# Patient Record
Sex: Female | Born: 1985 | Race: White | Hispanic: No | Marital: Married | Smoking: Never smoker
Health system: Southern US, Community
[De-identification: ages and names within clinical notes are randomized; demographics above are authoritative.]

## PROBLEM LIST (undated history)

## (undated) DIAGNOSIS — Z8052 Family history of malignant neoplasm of bladder: Secondary | ICD-10-CM

## (undated) DIAGNOSIS — Z8041 Family history of malignant neoplasm of ovary: Secondary | ICD-10-CM

## (undated) DIAGNOSIS — D62 Acute posthemorrhagic anemia: Secondary | ICD-10-CM

## (undated) DIAGNOSIS — Z803 Family history of malignant neoplasm of breast: Secondary | ICD-10-CM

## (undated) DIAGNOSIS — Z789 Other specified health status: Secondary | ICD-10-CM

## (undated) DIAGNOSIS — Z8042 Family history of malignant neoplasm of prostate: Secondary | ICD-10-CM

## (undated) HISTORY — DX: Family history of malignant neoplasm of ovary: Z80.41

## (undated) HISTORY — DX: Family history of malignant neoplasm of breast: Z80.3

## (undated) HISTORY — PX: NO PAST SURGERIES: SHX2092

## (undated) HISTORY — DX: Family history of malignant neoplasm of bladder: Z80.52

## (undated) HISTORY — DX: Family history of malignant neoplasm of prostate: Z80.42

---
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
## 2009-10-09 ENCOUNTER — Encounter: Admission: RE | Admit: 2009-10-09 | Discharge: 2009-12-25 | Payer: Self-pay | Admitting: Sports Medicine

## 2011-01-08 ENCOUNTER — Ambulatory Visit: Payer: Commercial Indemnity | Attending: Orthopedic Surgery | Admitting: Physical Therapy

## 2011-01-08 DIAGNOSIS — M25539 Pain in unspecified wrist: Secondary | ICD-10-CM | POA: Insufficient documentation

## 2011-01-08 DIAGNOSIS — IMO0001 Reserved for inherently not codable concepts without codable children: Secondary | ICD-10-CM | POA: Insufficient documentation

## 2011-01-08 DIAGNOSIS — R293 Abnormal posture: Secondary | ICD-10-CM | POA: Insufficient documentation

## 2011-01-16 ENCOUNTER — Ambulatory Visit: Payer: Commercial Indemnity | Admitting: Physical Therapy

## 2011-01-20 ENCOUNTER — Ambulatory Visit: Payer: Commercial Indemnity | Admitting: Physical Therapy

## 2011-01-21 ENCOUNTER — Ambulatory Visit: Payer: Commercial Indemnity | Admitting: Physical Therapy

## 2011-01-26 ENCOUNTER — Ambulatory Visit: Payer: Commercial Indemnity | Admitting: Physical Therapy

## 2011-01-29 ENCOUNTER — Ambulatory Visit: Payer: Commercial Indemnity | Admitting: Physical Therapy

## 2011-02-03 ENCOUNTER — Ambulatory Visit: Payer: Commercial Indemnity | Admitting: Physical Therapy

## 2011-02-04 ENCOUNTER — Ambulatory Visit: Payer: Commercial Indemnity | Attending: Orthopedic Surgery | Admitting: Physical Therapy

## 2011-02-04 DIAGNOSIS — M25539 Pain in unspecified wrist: Secondary | ICD-10-CM | POA: Insufficient documentation

## 2011-02-04 DIAGNOSIS — R293 Abnormal posture: Secondary | ICD-10-CM | POA: Insufficient documentation

## 2011-02-04 DIAGNOSIS — IMO0001 Reserved for inherently not codable concepts without codable children: Secondary | ICD-10-CM | POA: Insufficient documentation

## 2011-02-10 ENCOUNTER — Ambulatory Visit: Payer: Commercial Indemnity | Admitting: Physical Therapy

## 2011-03-05 ENCOUNTER — Ambulatory Visit: Payer: Commercial Indemnity | Attending: Orthopedic Surgery | Admitting: Physical Therapy

## 2011-03-05 DIAGNOSIS — M25539 Pain in unspecified wrist: Secondary | ICD-10-CM | POA: Insufficient documentation

## 2011-03-05 DIAGNOSIS — R293 Abnormal posture: Secondary | ICD-10-CM | POA: Insufficient documentation

## 2011-03-05 DIAGNOSIS — IMO0001 Reserved for inherently not codable concepts without codable children: Secondary | ICD-10-CM | POA: Insufficient documentation

## 2011-03-30 ENCOUNTER — Ambulatory Visit: Payer: Commercial Indemnity | Admitting: Physical Therapy

## 2015-03-03 NOTE — L&D Delivery Note (Signed)
Delivery Note At 2:54 AM a healthy female was delivered via Vaginal, Spontaneous Delivery (Presentation: Vertex). 1 loose [place].  APGAR: 9, 9 ; weight pending.   Placenta status: complete, 3 Vs.  Cord:  with the following complications: None.  Cord pH: N/A  Anesthesia:  Epidural Episiotomy: None Lacerations: 2nd degree Suture Repair: 2.0 3.0 vicryl rapide Est. Blood Loss (mL): 350  Mom to postpartum.  Baby to Couplet care / Skin to Skin.  Jaeden Messer,[name] 12/08/2015, 3:46 AM

## 2015-05-22 LAB — OB RESULTS CONSOLE HIV ANTIBODY (ROUTINE TESTING): HIV: NONREACTIVE

## 2015-05-22 LAB — OB RESULTS CONSOLE RPR: RPR: NONREACTIVE

## 2015-05-22 LAB — OB RESULTS CONSOLE ABO/RH: RH TYPE: POSITIVE

## 2015-05-22 LAB — OB RESULTS CONSOLE ANTIBODY SCREEN: ANTIBODY SCREEN: NEGATIVE

## 2015-05-22 LAB — OB RESULTS CONSOLE GC/CHLAMYDIA
Chlamydia: NEGATIVE
Gonorrhea: NEGATIVE

## 2015-05-22 LAB — OB RESULTS CONSOLE RUBELLA ANTIBODY, IGM: Rubella: IMMUNE

## 2015-05-22 LAB — OB RESULTS CONSOLE HEPATITIS B SURFACE ANTIGEN: HEP B S AG: NEGATIVE

## 2015-11-13 LAB — OB RESULTS CONSOLE GBS: STREP GROUP B AG: NEGATIVE

## 2015-12-07 ENCOUNTER — Inpatient Hospital Stay (HOSPITAL_COMMUNITY): Payer: BLUE CROSS/BLUE SHIELD | Admitting: Anesthesiology

## 2015-12-07 ENCOUNTER — Inpatient Hospital Stay (HOSPITAL_COMMUNITY)
Admission: AD | Admit: 2015-12-07 | Discharge: 2015-12-10 | DRG: 774 | Disposition: A | Discharge status: Home / Self Care | Payer: BLUE CROSS/BLUE SHIELD | Source: Ambulatory Visit | Attending: Obstetrics & Gynecology | Admitting: Obstetrics & Gynecology

## 2015-12-07 ENCOUNTER — Encounter (HOSPITAL_COMMUNITY): Payer: Self-pay | Admitting: Certified Nurse Midwife

## 2015-12-07 DIAGNOSIS — O9081 Anemia of the puerperium: Secondary | ICD-10-CM | POA: Diagnosis not present

## 2015-12-07 DIAGNOSIS — Z3A38 38 weeks gestation of pregnancy: Secondary | ICD-10-CM

## 2015-12-07 DIAGNOSIS — D62 Acute posthemorrhagic anemia: Secondary | ICD-10-CM | POA: Diagnosis not present

## 2015-12-07 DIAGNOSIS — O4292 Full-term premature rupture of membranes, unspecified as to length of time between rupture and onset of labor: Secondary | ICD-10-CM | POA: Diagnosis present

## 2015-12-07 DIAGNOSIS — E861 Hypovolemia: Secondary | ICD-10-CM | POA: Diagnosis not present

## 2015-12-07 HISTORY — DX: Acute posthemorrhagic anemia: D62

## 2015-12-07 HISTORY — DX: Other specified health status: Z78.9

## 2015-12-07 LAB — CBC
HEMATOCRIT: 34.3 % — AB (ref 36.0–46.0)
Hemoglobin: 11.3 g/dL — ABNORMAL LOW (ref 12.0–15.0)
MCH: 27.3 pg (ref 26.0–34.0)
MCHC: 32.9 g/dL (ref 30.0–36.0)
MCV: 82.9 fL (ref 78.0–100.0)
Platelets: 285 10*3/uL (ref 150–400)
RBC: 4.14 MIL/uL (ref 3.87–5.11)
RDW: 14.2 % (ref 11.5–15.5)
WBC: 15 10*3/uL — AB (ref 4.0–10.5)

## 2015-12-07 LAB — TYPE AND SCREEN
ABO/RH(D): O POS
Antibody Screen: NEGATIVE

## 2015-12-07 LAB — ABO/RH: ABO/RH(D): O POS

## 2015-12-07 MED ORDER — LACTATED RINGERS IV SOLN
INTRAVENOUS | Status: DC
  Administered 2015-12-07: 23:00:00 via INTRAVENOUS

## 2015-12-07 MED ORDER — LIDOCAINE HCL (PF) 1 % IJ SOLN
30.0000 mL | INTRAMUSCULAR | Status: DC | PRN
  Filled 2015-12-07: qty 30

## 2015-12-07 MED ORDER — FENTANYL 2.5 MCG/ML BUPIVACAINE 1/10 % EPIDURAL INFUSION (WH - ANES)
14.0000 mL/h | INTRAMUSCULAR | Status: DC | PRN
  Administered 2015-12-07: 14 mL/h via EPIDURAL
  Filled 2015-12-07: qty 125

## 2015-12-07 MED ORDER — PHENYLEPHRINE 40 MCG/ML (10ML) SYRINGE FOR IV PUSH (FOR BLOOD PRESSURE SUPPORT)
80.0000 ug | PREFILLED_SYRINGE | INTRAVENOUS | Status: DC | PRN
  Filled 2015-12-07: qty 5

## 2015-12-07 MED ORDER — FENTANYL 2.5 MCG/ML BUPIVACAINE 1/10 % EPIDURAL INFUSION (WH - ANES): 14.0000 mL/h | INTRAMUSCULAR | Status: DC | PRN

## 2015-12-07 MED ORDER — DIPHENHYDRAMINE HCL 50 MG/ML IJ SOLN: 12.5000 mg | INTRAMUSCULAR | Status: DC | PRN

## 2015-12-07 MED ORDER — ACETAMINOPHEN 325 MG PO TABS: 650.0000 mg | ORAL_TABLET | ORAL | Status: DC | PRN

## 2015-12-07 MED ORDER — LACTATED RINGERS IV SOLN: 500.0000 mL | Freq: Once | INTRAVENOUS | Status: DC

## 2015-12-07 MED ORDER — OXYTOCIN 40 UNITS IN LACTATED RINGERS INFUSION - SIMPLE MED
2.5000 [IU]/h | INTRAVENOUS | Status: DC
  Filled 2015-12-07: qty 1000

## 2015-12-07 MED ORDER — PHENYLEPHRINE 40 MCG/ML (10ML) SYRINGE FOR IV PUSH (FOR BLOOD PRESSURE SUPPORT)
80.0000 ug | PREFILLED_SYRINGE | INTRAVENOUS | Status: DC | PRN
  Filled 2015-12-07: qty 5
  Filled 2015-12-07: qty 10

## 2015-12-07 MED ORDER — OXYTOCIN BOLUS FROM INFUSION
500.0000 mL | Freq: Once | INTRAVENOUS | Status: AC
  Administered 2015-12-08: 500 mL via INTRAVENOUS

## 2015-12-07 MED ORDER — SOD CITRATE-CITRIC ACID 500-334 MG/5ML PO SOLN: 30.0000 mL | ORAL | Status: DC | PRN

## 2015-12-07 MED ORDER — FLEET ENEMA 7-19 GM/118ML RE ENEM: 1.0000 | ENEMA | RECTAL | Status: DC | PRN

## 2015-12-07 MED ORDER — OXYCODONE-ACETAMINOPHEN 5-325 MG PO TABS: 2.0000 | ORAL_TABLET | ORAL | Status: DC | PRN

## 2015-12-07 MED ORDER — LIDOCAINE HCL (PF) 1 % IJ SOLN
INTRAMUSCULAR | Status: DC | PRN
  Administered 2015-12-07 (×2): 5 mL

## 2015-12-07 MED ORDER — LACTATED RINGERS IV SOLN: 500.0000 mL | INTRAVENOUS | Status: DC | PRN

## 2015-12-07 MED ORDER — EPHEDRINE 5 MG/ML INJ
10.0000 mg | INTRAVENOUS | Status: DC | PRN
  Filled 2015-12-07: qty 4

## 2015-12-07 MED ORDER — ONDANSETRON HCL 4 MG/2ML IJ SOLN
4.0000 mg | Freq: Four times a day (QID) | INTRAMUSCULAR | Status: DC | PRN
  Administered 2015-12-07: 4 mg via INTRAVENOUS
  Filled 2015-12-07 (×2): qty 2

## 2015-12-07 MED ORDER — OXYCODONE-ACETAMINOPHEN 5-325 MG PO TABS: 1.0000 | ORAL_TABLET | ORAL | Status: DC | PRN

## 2015-12-07 MED ORDER — LACTATED RINGERS IV SOLN
500.0000 mL | Freq: Once | INTRAVENOUS | Status: AC
  Administered 2015-12-07: 500 mL via INTRAVENOUS

## 2015-12-07 NOTE — Anesthesia Procedure Notes (Signed)
Epidural Patient location during procedure: OB Start time: 12/07/2015 8:45 PM End time: 12/07/2015 8:52 PM  Staffing Anesthesiologist: Reginal Lutes Performed: anesthesiologist   Preanesthetic Checklist Completed: patient identified, site marked, surgical consent, pre-op evaluation, timeout performed, IV checked, risks and benefits discussed and monitors and equipment checked  Epidural Patient position: sitting Prep: site prepped and draped and DuraPrep Patient monitoring: continuous pulse ox and blood pressure Approach: midline Location: L4-L5 Injection technique: LOR saline  Needle:  Needle type: Tuohy  Needle gauge: 17 G Needle length: 9 cm and 9 Needle insertion depth: 5 cm cm Catheter type: closed end flexible Catheter size: 19 Gauge Catheter at skin depth: 10 cm Test dose: negative  Assessment Events: blood not aspirated, injection not painful, no injection resistance, negative IV test and no paresthesia

## 2015-12-07 NOTE — Anesthesia Pain Management Evaluation Note (Signed)
  CRNA Pain Management Visit Note  Patient: Kerry Potter, 30 y.o., female  "Hello I am a member of the anesthesia team at [facility]. We have an anesthesia team available at all times to provide care throughout the hospital, including epidural management and anesthesia for C-section. I don't know your plan for the delivery whether it a natural birth, water birth, IV sedation, nitrous supplementation, doula or epidural, but we want to meet your pain goals."   1.Was your pain managed to your expectations on prior hospitalizations?   Yes   2.What is your expectation for pain management during this hospitalization?     Epidural  3.How can we help you reach that goal? epidural  Record the patient's initial score and the patient's pain goal.   Pain: 7/10  Pain Goal: 4/10 [facility] wants you to be able to say your pain was always managed very well.  Ailene Ards 12/07/2015

## 2015-12-07 NOTE — Anesthesia Preprocedure Evaluation (Signed)
Anesthesia Evaluation  Patient identified by MRN, date of birth, ID band Patient awake    Reviewed: Allergy & Precautions, NPO status , Patient's Chart, lab work & pertinent test results  Airway Mallampati: II  TM Distance: >3 FB Neck ROM: Full    Dental no notable dental hx.    Pulmonary neg pulmonary ROS,    Pulmonary exam normal        Cardiovascular negative cardio ROS Normal cardiovascular exam     Neuro/Psych negative neurological ROS  negative psych ROS   GI/Hepatic negative GI ROS, Neg liver ROS,   Endo/Other  negative endocrine ROS  Renal/GU negative Renal ROS  negative genitourinary   Musculoskeletal negative musculoskeletal ROS (+)   Abdominal   Peds negative pediatric ROS (+)  Hematology negative hematology ROS (+)   Anesthesia Other Findings   Reproductive/Obstetrics (+) Pregnancy                             Anesthesia Physical Anesthesia Plan  ASA: II  Anesthesia Plan: Epidural   Post-op Pain Management:    Induction:   Airway Management Planned:   Additional Equipment:   Intra-op Plan:   Post-operative Plan:   Informed Consent:   Plan Discussed with:   Anesthesia Plan Comments:         Anesthesia Quick Evaluation

## 2015-12-07 NOTE — Progress Notes (Signed)
Subjective: Doing well, pain ++, UCs 2-3  Anesthesia Requesting epidural   Objective: BP 125/75   Pulse 92   Temp 98.3 F (36.8 C) (Oral)   Resp 18   Ht 5\' 6"  (1.676 m)   Wt 205 lb (93 kg)   SpO2 100%   BMI 33.09 kg/m    FHT:  FHR: 140-150 bpm, variability: moderate,  accelerations:  Present,  decelerations:  Absent UC:   regular, every 2-3 minutes VE:   Dilation: 4 Effacement (%): 90 Station: 0 Exam by:: Dr. Dellis Filbert   Assessment / Plan: Spontaneous labor, progressing normally  Fetal Wellbeing:  Category I Pain Control:  Epidural  Anticipated MOD:  NSVD  Kerry Potter,Kerry Potter 12/07/2015, 9:52 PM

## 2015-12-07 NOTE — H&P (Signed)
Kerry Potter is a 30 y.o. female G1P0 [redacted]w[redacted]d presenting for SROM, early spontaneous labor.  HPP/HPI:  FMs pos, no vaginal bleeding.  SROM clear AF ++.  Mild UCs q 3 min.  No PEC Sx.  Normal pregnancy.  GBS neg.  OB History    Gravida Para Term Preterm AB Living   1             SAB TAB Ectopic Multiple Live Births                 Past Medical History:  Diagnosis Date  . Medical history non-contributory    Past Surgical History:  Procedure Laterality Date  . NO PAST SURGERIES     Family History: family history is not on file. Social History:  reports that she has never smoked. She has never used smokeless tobacco. She reports that she does not drink alcohol or use drugs.  Allergies  Allergen Reactions  . Mobic [Meloxicam] Diarrhea    Dilation: 3 Effacement (%): 70 Station: -3 Exam by:: A. Jones RNC   Blood pressure 131/78, pulse 82, temperature 97.7 F (36.5 C), temperature source Oral, resp. rate 18, height 5\' 6"  (1.676 m), weight 205 lb (93 kg). Exam Physical Exam   UCs q 2-3 min ++ FHR 140-150's with good variability, accelerations present, no deceleration.  HPP:  Patient Active Problem List   Diagnosis Date Noted  . Normal labor 12/07/2015    Prenatal labs: ABO, Rh:  O Pos Antibody:  Neg Rubella: Immune RPR:  NR  HBsAg:  NR  HIV:  NR Genetic testing:  Ultrascreen neg, AFP1 neg [place] anato: wnl 1 hr GTT: 133 wnl GBS:  Neg  Assessment/Plan: G1 38 2/7 wks with SROM, early labor.  Normal pregnancy.  FHR cat 1.  Expectant management towards probable Vaginal delivery.  Epidural PRN.   Keisha Amer,[name] 12/07/2015, 6:18 PM

## 2015-12-07 NOTE — MAU Note (Signed)
Pt states her water broke at 2:30PM. Pt denies vaginal bleeding. Fetus is active. Pt states she feels pressure but no real ctxs.

## 2015-12-08 ENCOUNTER — Encounter (HOSPITAL_COMMUNITY): Payer: Self-pay | Admitting: *Deleted

## 2015-12-08 LAB — CBC
HCT: 24.9 % — ABNORMAL LOW (ref 36.0–46.0)
HCT: 22.8 % — ABNORMAL LOW (ref 36.0–46.0)
Hemoglobin: 8.6 g/dL — ABNORMAL LOW (ref 12.0–15.0)
Hemoglobin: 7.9 g/dL — ABNORMAL LOW (ref 12.0–15.0)
MCH: 27.9 pg (ref 26.0–34.0)
MCH: 28.4 pg (ref 26.0–34.0)
MCHC: 34.1 g/dL (ref 30.0–36.0)
MCHC: 34.6 g/dL (ref 30.0–36.0)
MCV: 81.6 fL (ref 78.0–100.0)
MCV: 82 fL (ref 78.0–100.0)
PLATELETS: 222 10*3/uL (ref 150–400)
Platelets: 202 10*3/uL (ref 150–400)
RBC: 3.05 MIL/uL — AB (ref 3.87–5.11)
RBC: 2.78 MIL/uL — ABNORMAL LOW (ref 3.87–5.11)
RDW: 14.2 % (ref 11.5–15.5)
RDW: 14.2 % (ref 11.5–15.5)
WBC: 21.6 10*3/uL — AB (ref 4.0–10.5)
WBC: 17.5 10*3/uL — ABNORMAL HIGH (ref 4.0–10.5)

## 2015-12-08 LAB — RPR: RPR: NONREACTIVE

## 2015-12-08 MED ORDER — TETANUS-DIPHTH-ACELL PERTUSSIS 5-2.5-18.5 LF-MCG/0.5 IM SUSP: 0.5000 mL | Freq: Once | INTRAMUSCULAR | Status: DC

## 2015-12-08 MED ORDER — ONDANSETRON HCL 4 MG PO TABS: 4.0000 mg | ORAL_TABLET | ORAL | Status: DC | PRN

## 2015-12-08 MED ORDER — WITCH HAZEL-GLYCERIN EX PADS
1.0000 "application " | MEDICATED_PAD | CUTANEOUS | Status: DC | PRN
  Administered 2015-12-09: 1 via TOPICAL

## 2015-12-08 MED ORDER — OXYTOCIN 40 UNITS IN LACTATED RINGERS INFUSION - SIMPLE MED: 2.5000 [IU]/h | INTRAVENOUS | Status: DC | PRN

## 2015-12-08 MED ORDER — ONDANSETRON HCL 4 MG/2ML IJ SOLN: 4.0000 mg | INTRAMUSCULAR | Status: DC | PRN

## 2015-12-08 MED ORDER — BENZOCAINE-MENTHOL 20-0.5 % EX AERO
1.0000 "application " | INHALATION_SPRAY | CUTANEOUS | Status: DC | PRN
  Administered 2015-12-08 – 2015-12-09 (×2): 1 via TOPICAL
  Filled 2015-12-08 (×4): qty 56

## 2015-12-08 MED ORDER — IBUPROFEN 800 MG PO TABS
800.0000 mg | ORAL_TABLET | Freq: Three times a day (TID) | ORAL | Status: DC
  Administered 2015-12-08 – 2015-12-10 (×7): 800 mg via ORAL
  Filled 2015-12-08 (×7): qty 1

## 2015-12-08 MED ORDER — LACTATED RINGERS IV SOLN
INTRAVENOUS | Status: AC
  Administered 2015-12-08 (×2): via INTRAVENOUS

## 2015-12-08 MED ORDER — ACETAMINOPHEN 325 MG PO TABS
650.0000 mg | ORAL_TABLET | ORAL | Status: DC | PRN
  Administered 2015-12-08 – 2015-12-10 (×7): 650 mg via ORAL
  Filled 2015-12-08 (×7): qty 2

## 2015-12-08 MED ORDER — LACTATED RINGERS IV SOLN
INTRAVENOUS | Status: DC
  Administered 2015-12-08: 11:00:00 via INTRAVENOUS

## 2015-12-08 MED ORDER — COCONUT OIL OIL
1.0000 | TOPICAL_OIL | Status: DC | PRN
Start: 2015-12-08 — End: 2015-12-10
  Administered 2015-12-09: 1 via TOPICAL
  Filled 2015-12-08: qty 120

## 2015-12-08 MED ORDER — SIMETHICONE 80 MG PO CHEW: 80.0000 mg | CHEWABLE_TABLET | ORAL | Status: DC | PRN

## 2015-12-08 MED ORDER — IBUPROFEN 600 MG PO TABS
600.0000 mg | ORAL_TABLET | Freq: Four times a day (QID) | ORAL | Status: DC
  Administered 2015-12-08: 600 mg via ORAL
  Filled 2015-12-08: qty 1

## 2015-12-08 MED ORDER — PRENATAL MULTIVITAMIN CH
1.0000 | ORAL_TABLET | Freq: Every day | ORAL | Status: DC
  Administered 2015-12-08 – 2015-12-10 (×3): 1 via ORAL
  Filled 2015-12-08 (×3): qty 1

## 2015-12-08 MED ORDER — SENNOSIDES-DOCUSATE SODIUM 8.6-50 MG PO TABS
2.0000 | ORAL_TABLET | ORAL | Status: DC
  Administered 2015-12-09 – 2015-12-10 (×2): 2 via ORAL
  Filled 2015-12-08 (×2): qty 2

## 2015-12-08 MED ORDER — DIPHENHYDRAMINE HCL 25 MG PO CAPS: 25.0000 mg | ORAL_CAPSULE | Freq: Four times a day (QID) | ORAL | Status: DC | PRN

## 2015-12-08 MED ORDER — DIBUCAINE 1 % RE OINT
1.0000 "application " | TOPICAL_OINTMENT | RECTAL | Status: DC | PRN
  Administered 2015-12-09: 1 via RECTAL
  Filled 2015-12-08: qty 28

## 2015-12-08 MED ORDER — ZOLPIDEM TARTRATE 5 MG PO TABS: 5.0000 mg | ORAL_TABLET | Freq: Every evening | ORAL | Status: DC | PRN

## 2015-12-08 NOTE — Progress Notes (Signed)
Assisted patient out of bed to the bathroom. Voided an adequate amount of urine. While sitting on the toilet the patient states she feels her headache is getting worse and her ears are ringing. I pulled the emergency bell and had a tech bring in the stedy. Upon standing the patient lost consciousness for about 30 seconds, with the assistance of a tech and RN we were able to get the patient to bed in the stedy. After getting into bed the patient stated she feels better but is hot. Vitals: 99/57, 98, 22, 99.8, 100%. Patient instructed not to get out of bed without assistance.

## 2015-12-08 NOTE — Anesthesia Postprocedure Evaluation (Signed)
Anesthesia Post Note  Patient: Kerry Potter  Procedure(s) Performed: * No procedures listed *  Patient location during evaluation: Mother Baby Anesthesia Type: Epidural Level of consciousness: awake and alert Pain management: pain level controlled Vital Signs Assessment: post-procedure vital signs reviewed and stable Respiratory status: spontaneous breathing and nonlabored ventilation Cardiovascular status: stable Postop Assessment: no headache, patient able to bend at knees, no backache, no signs of nausea or vomiting, epidural receding and adequate PO intake Anesthetic complications: no     Last Vitals:  Vitals:   12/08/15 1032 12/08/15 1100  BP: 112/63 115/63  Pulse: (!) 146 80  Resp:    Temp:      Last Pain:  Vitals:   12/08/15 0919  TempSrc: Axillary  PainSc:    Pain Goal: Patients Stated Pain Goal: 7 (12/07/15 1815)               France Ravens Hristova

## 2015-12-08 NOTE — Progress Notes (Signed)
Dr. Pamala Hurry notified of pts 1700 CBC results and orthostatic VS. Reviewed pts status with MD. Pt reports feeling better than she did this AM. She has napped and had several meals throughout day. She is drinking po fluids and has good urine output. Uterus is U-1 bleeding is small.  She reports still feeling "shakey" when getting up but declines feeling dizzy or lightheaded with getting up. Pt is using bedside commode today. MD states to continue LR @ 150cc/hr through night and recheck CBC 10/9 @ 0500.

## 2015-12-08 NOTE — Progress Notes (Signed)
TC from nurse.  No further syncopal episodes but pt weak and shaky, more with ambulation. Pt notes overall feeling better, did eat. No significant vaginal bleeding.  Vitals reviewed  CBC Latest Ref Rng & Units 12/08/2015 12/08/2015 12/07/2015  WBC 4.0 - 10.5 K/uL 17.5(H) 21.6(H) 15.0(H)  Hemoglobin 12.0 - 15.0 g/dL 7.9(L) 8.6(L) 11.3(L)  Hematocrit 36.0 - 46.0 % 22.8(L) 24.9(L) 34.3(L)  Platelets 150 - 400 K/uL 202 222 285   A/P: ABL anemia. Continue IVF.  On iron. Repeat CBC in am. Repeat orthostatics in am, if continues with orthostasis by sx or vitals will consider blood transfusion. Good urine output and pt feeling better all reassuring.  Kerry Potter A. 12/08/2015 6:37 PM

## 2015-12-08 NOTE — Progress Notes (Signed)
Patient light headed on toilet. Assisted to Wheelchair via stedy, VSS.

## 2015-12-08 NOTE — Progress Notes (Signed)
   12/08/15 0919  Vital Signs  BP (!) 105/52  Pulse Rate 90  Resp 18  Temp 99 F (37.2 C)  Temp Source Axillary  Oxygen Therapy  SpO2 98 %  O2 Device Room Air   Pt with previous episodes of fainting with going to bathroom. Pt has had breakfast and a nap this morning. She reports needing to void this morning. Additional RN with primary RN when getting pt up to bathroom. Pt ambulated to bathroom and voided, when getting back to bed she reports feeling slightly lightheaded, no fainting episode. Pt helped back to bed and laying down-states lightheaded feeling is gone now. Fundus U-1 firm bleeding is small. VS [id number] listed above. Artelia Laroche CNM notified of pts fainted episodes and VS/assessment. CBC is pending, no new orders at this time.

## 2015-12-08 NOTE — Progress Notes (Addendum)
   Nurse reports now that patient has experienced two episodes of syncope with ambulation out of bed with BRP - provider not notified earlier.   No symptoms while in bed. No food intake until this am @ breakfast. (GTT 133 - no GDM). Void x 2 since delivery.  Admit Hgb 11.3 with 28 weeks Hgb 11.6 - AM CBC pending. Documented delivery EBL 337ml but no report of blood loss from nurses in BS or PP with BRP or number of pads changed. Likely far greater blood loss PP.  Call for CBC results (drawn [id number])  - Hgb 8.6 with Hct 24 Likely combined blood loss ~ 1500-203ml based on drop.  VS: 99.8 - 98 - 18 - 105/52 and 99/57 with no narcotic meds.  Lethargic and sleeping from "pain meds" and fatigue Heart rate 102 regular Lungs clear and unlabored Abdomen soft and non-tender Uterus firm Peri-pad with moderate blood present   A/P:  PP day 0 SVD with 2nd degree repair          S/p 2 syncopal episodes with BRP          Hypovolemia with 10 point drop in HCT           ABL anemia            1) IVF today           2) Void Q 2 hours with assistance           3) start iron and magnesium           4) repeat CBC in AM            5) orthostatics Q 12 hours x 3  Artelia Laroche CNM FACNM   Addendum @ 1115am:    Reviewed AM orthostatics with increased pulse with stable BP. Bedside commode PRN with assistance without symptoms. Continue IVF and oral hydration. Repeat CBC and orthostatics 5pm - if fluid replacement does not improve status, will need blood transfusion.   Spoke to patient and spouse - updated with status / plan to recheck and re-evaluate for blood transfusion. Understands & agrees.  Dr Dellis Filbert updated with status.  Artelia Laroche CNM [facility]

## 2015-12-08 NOTE — Lactation Note (Signed)
This note was copied from a baby's chart. Lactation Consultation Note  Patient Name: Kerry Potter Today's Date: 12/08/2015 Reason for consult: Initial assessment Breastfeeding consultation services and support information given and reviewed with patient.  This is her first baby.  Baby is 73 hours old.  Mom states baby has latched well at times but recently RN fit her with a nipple shield due to difficult latch.  Attempted to assist with latching baby in cross cradle hold but baby fussy and unable to latch.  We then positioned baby in football hold on the right side.  Mom has short nipples and baby having difficulty grasping breast.  24 mm nipple shield applied and baby latched easily.  Observed baby nurse well for 10 minutes with swallows noted.  Baby was continuing to nurse when I left.  Reviewed basics and answered questions.  Encouraged to call for assist/concerns prn.  Maternal Data Has patient been taught Hand Expression?: Yes Does the patient have breastfeeding experience prior to this delivery?: No  Feeding Feeding Type: Breast Fed  LATCH Score/Interventions Latch: Grasps breast easily, tongue down, lips flanged, rhythmical sucking. (with a 24 mm nipple shield)  Audible Swallowing: A few with stimulation Intervention(s): Skin to skin;Hand expression;Alternate breast massage  Type of Nipple: Everted at rest and after stimulation (short)  Comfort (Breast/Nipple): Soft / non-tender     Hold (Positioning): Assistance needed to correctly position infant at breast and maintain latch. Intervention(s): Breastfeeding basics reviewed;Support Pillows;Position options;Skin to skin  LATCH Score: 8  Lactation Tools Discussed/Used Tools: Nipple Shields Nipple shield size: 24   Consult Status Consult Status: Follow-up Date: 12/09/15 Follow-up type: In-patient    Ave Filter 12/08/2015, 2:31 PM

## 2015-12-09 LAB — CBC
HCT: 22.1 % — ABNORMAL LOW (ref 36.0–46.0)
Hemoglobin: 7.5 g/dL — ABNORMAL LOW (ref 12.0–15.0)
MCH: 28.1 pg (ref 26.0–34.0)
MCHC: 33.9 g/dL (ref 30.0–36.0)
MCV: 82.8 fL (ref 78.0–100.0)
Platelets: 179 10*3/uL (ref 150–400)
RBC: 2.67 MIL/uL — ABNORMAL LOW (ref 3.87–5.11)
RDW: 14.8 % (ref 11.5–15.5)
WBC: 14.9 10*3/uL — ABNORMAL HIGH (ref 4.0–10.5)

## 2015-12-09 MED ORDER — OXYCODONE-ACETAMINOPHEN 5-325 MG PO TABS
1.0000 | ORAL_TABLET | Freq: Once | ORAL | Status: AC
  Administered 2015-12-09: 1 via ORAL
  Filled 2015-12-09: qty 1

## 2015-12-09 NOTE — Progress Notes (Signed)
PPD # 1 SVD Information for the patient's newborn:  Aloise, [place] [id number]  female   S:  Reports feeling cramping in lower abdomen and perineal pain. Improved after ibuprofen this am, worsening now. Wants to sleep, feels very tired.  Syncope x 2 yesterday after delivery. Severe anemia noted and rcvd fluids overnight. Has been counseled for blood transfusion and declines. Has been voiding frequently.              Tolerating po/ No nausea or vomiting             Bleeding is light  Breastfeeding, sore nipples. Baby on phototherapy.                         O:  A & O x 3, in no apparent distress              VS:  Vitals:   12/08/15 1235 12/08/15 1711 12/08/15 1714 12/08/15 1716  BP: 112/61 (!) 119/55 107/62 (!) 122/58  Pulse: 86 (!) 107 100 (!) 119  Resp:  18    Temp:  98.1 F (36.7 C)    TempSrc:  Oral    SpO2:      Weight:      Height:        LABS:  Recent Labs  12/08/15 1619 12/09/15 0510  WBC 17.5* 14.9*  HGB 7.9* 7.5*  HCT 22.8* 22.1*  PLT 202 179    Blood type: --/--/O POS, O POS (10/07 1820)  Rubella: Immune (03/22 0000)   I&O: I/O last 3 completed shifts: In: 2530 [I.V.:2530] Out: [id number] [Urine:3800; Blood:350]          No intake/output data recorded.  Lungs: Clear and unlabored  Heart: regular rate and rhythm / no murmurs  Abdomen: soft, non-tender, non-distended             Fundus: firm, non-tender, U+1  Perineum: mild edema, repair intact, no hemorrhoids  Lochia: small  Extremities: +1 edema, no calf pain or tenderness    A/P: PPD # 1 30 y.o., G1P1001   Principal Problem:   Postpartum care following vaginal delivery (10/8) Active Problems:   Postpartum state   SVD (spontaneous vaginal delivery) Acute blood loss anemia, mild tachy with standing Reviewed blood transfusion R/B, and recommend at this time if unable to increase activity. Encouraged frequent voids to reduce cramping, continue PO NSAID and alternate with APAP One time narcotic  order to rest, then encouraged OOB Sitz baths today for comfort.  Anticipate discharge tomorrow    Juliene Pina, MSN, CNM 12/09/2015, 11:03 AM

## 2015-12-09 NOTE — Lactation Note (Signed)
This note was copied from a baby's chart. Lactation Consultation Note  Patient Name: Girl Abrielle Goldammer Today's Date: 12/09/2015  Follow up visit made.  Baby is receiving phototherapy.  Mom pumping with symphony pump for the first times.  She is complaining of nipple soreness.  Instructed to increase flange size to 27 mm and use coconut oil on the inside of flanges.  Mom pumped 1 ml of colostrum which was dropper fed to baby. Nipples pink but intact.  Comfort gels given with instructions to wear when not using the coconut oil.  Instructed to feed with any feeding cue and post pump after feeds giving any expressed milk back to baby.  Mom states colostrum is present in shield after feeds.  Mom is having much overall discomfort.  Encouraged to call with concerns/assist.   Maternal Data    Feeding    LATCH Score/Interventions                      Lactation Tools Discussed/Used     Consult Status      Ave Filter 12/09/2015, 10:28 AM

## 2015-12-10 ENCOUNTER — Encounter (HOSPITAL_COMMUNITY): Payer: Self-pay | Admitting: Obstetrics and Gynecology

## 2015-12-10 DIAGNOSIS — D62 Acute posthemorrhagic anemia: Secondary | ICD-10-CM | POA: Diagnosis not present

## 2015-12-10 HISTORY — DX: Acute posthemorrhagic anemia: D62

## 2015-12-10 MED ORDER — MAGNESIUM OXIDE 400 (241.3 MG) MG PO TABS: 400.0000 mg | ORAL_TABLET | Freq: Every day | ORAL | 3 refills | Status: DC

## 2015-12-10 MED ORDER — POLYSACCHARIDE IRON COMPLEX 150 MG PO CAPS
150.0000 mg | ORAL_CAPSULE | Freq: Two times a day (BID) | ORAL | Status: DC
  Administered 2015-12-10: 150 mg via ORAL
  Filled 2015-12-10: qty 1

## 2015-12-10 MED ORDER — ACETAMINOPHEN 500 MG PO TABS: 1000.0000 mg | ORAL_TABLET | Freq: Four times a day (QID) | ORAL | 0 refills | Status: DC | PRN

## 2015-12-10 MED ORDER — MAGNESIUM OXIDE 400 (241.3 MG) MG PO TABS
400.0000 mg | ORAL_TABLET | Freq: Every day | ORAL | Status: DC
  Administered 2015-12-10: 400 mg via ORAL
  Filled 2015-12-10 (×2): qty 1

## 2015-12-10 MED ORDER — IBUPROFEN 600 MG PO TABS: 600.0000 mg | ORAL_TABLET | Freq: Four times a day (QID) | ORAL | 1 refills | Status: DC | PRN

## 2015-12-10 MED ORDER — POLYSACCHARIDE IRON COMPLEX 150 MG PO CAPS: 150.0000 mg | ORAL_CAPSULE | Freq: Two times a day (BID) | ORAL | 3 refills | Status: DC

## 2015-12-10 NOTE — Discharge Instructions (Signed)
Breast Pumping Tips °If you are breastfeeding, there may be times when you cannot feed your baby directly. Returning to work or going on a trip are common examples. Pumping allows you to store breast milk and feed it to your baby later.  °You may not get much milk when you first start to pump. Your breasts should start to make more after a few days. If you pump at the times you usually feed your baby, you may be able to keep making enough milk to feed your baby without also using formula. The more often you pump, the more milk you will produce.  °WHEN SHOULD I PUMP?  °· You can begin to pump soon after delivery. However, some experts recommend waiting about 4 weeks before giving your infant a bottle to make sure breastfeeding is going well.  °· If you plan to return to work, begin pumping a few weeks before. This will help you develop techniques that work best for you. It also lets you build up a supply of breast milk.   °· When you are with your infant, feed on demand and pump after each feeding.   °· When you are away from your infant for several hours, pump for about 15 minutes every 2-3 hours. Pump both breasts at the same time if you can.   °· If your infant has a formula feeding, make sure to pump around the same time.     °· If you drink any alcohol, wait 2 hours before pumping.   °HOW DO I PREPARE TO PUMP? °Your let-down reflex is the natural reaction to stimulation that makes your breast milk flow. It is easier to stimulate this reflex when you are relaxed. Find relaxation techniques that work for you. If you have difficulty with your let-down reflex, try these methods:  °· Smell one of your infant's blankets or an item of clothing.   °· Look at a picture or video of your infant.   °· Sit in a quiet, private space.   °· Massage the breast you plan to pump.   °· Place soothing warmth on the breast.   °· Play relaxing music.   °WHAT ARE SOME GENERAL BREAST PUMPING TIPS? °· Wash your hands before you pump. You  do not need to wash your nipples or breasts. °· There are three ways to pump. °¨ You can use your hand to massage and compress your breast. °¨ You can use a handheld manual pump. °¨ You can use an electric pump.   °· Make sure the suction cup (flange) on the breast pump is the right size. Place the flange directly over the nipple. If it is the wrong size or placed the wrong way, it may be painful and cause nipple damage.   °· If pumping is uncomfortable, apply a small amount of purified or modified lanolin to your nipple and areola. °· If you are using an electric pump, adjust the speed and suction power to be more comfortable. °· If pumping is painful or if you are not getting very much milk, you may need a different type of pump. A lactation consultant can help you determine what type of pump to use.   °· Keep a full water bottle near you at all times. Drinking lots of fluid helps you make more milk.  °· You can store your milk to use later. Pumped breast milk can be stored in a sealable, sterile container or plastic bag. Label all stored breast milk with the date you pumped it. °¨ Milk can stay out at room temperature for up to 8 hours. °¨   You can store your milk in the refrigerator for up to 8 days. °¨ You can store your milk in the freezer for 3 months. Thaw frozen milk using warm water. Do not put it in the microwave. °· Do not smoke. Smoking can lower your milk supply and harm your infant. If you need help quitting, ask your health care provider to recommend a program.   °WHEN SHOULD I CALL MY HEALTH CARE PROVIDER OR A LACTATION CONSULTANT? °· You are having trouble pumping. °· You are concerned that you are not making enough milk. °· You have nipple pain, soreness, or redness. °· You want to use birth control. Birth control pills may lower your milk supply. Talk to your health care provider about your options. °  °This information is not intended to replace advice given to you by your health care provider.  Make sure you discuss any questions you have with your health care provider. °  °Document Released: 08/06/2009 Document Revised: 02/21/2013 Document Reviewed: 12/09/2012 °[employer]vier Interactive Patient Education ©2016 [employer] °Postpartum Depression and Baby Blues °The postpartum period begins right after the birth of a baby. During this time, there is often a great amount of joy and excitement. It is also a time of many changes in the life of the parents. Regardless of how many times a mother gives birth, each child brings new challenges and dynamics to the family. It is not unusual to have feelings of excitement along with confusing shifts in moods, emotions, and thoughts. All mothers are at risk of developing postpartum depression or the "baby blues." These mood changes can occur right after giving birth, or they may occur many months after giving birth. The baby blues or postpartum depression can be mild or severe. Additionally, postpartum depression can go away rather quickly, or it can be a long-term condition.  °CAUSES °Raised hormone levels and the rapid drop in those levels are thought to be a main cause of postpartum depression and the baby blues. A number of hormones change during and after pregnancy. Estrogen and progesterone usually decrease right after the delivery of your baby. The levels of thyroid hormone and various cortisol steroids also rapidly drop. Other factors that play a role in these mood changes include major life events and genetics.  °RISK FACTORS °If you have any of the following risks for the baby blues or postpartum depression, know what symptoms to watch out for during the postpartum period. Risk factors that may increase the likelihood of getting the baby blues or postpartum depression include: °· Having a personal or family history of depression.   °· Having depression while being pregnant.   °· Having premenstrual mood issues or mood issues related to oral  contraceptives. °· Having a lot of life stress.   °· Having marital conflict.   °· Lacking a social support network.   °· Having a baby with special needs.   °· Having health problems, such as diabetes.   °SIGNS AND SYMPTOMS °Symptoms of baby blues include: °· Brief changes in mood, such as going from extreme happiness to sadness. °· Decreased concentration.   °· Difficulty sleeping.   °· Crying spells, tearfulness.   °· Irritability.   °· Anxiety.   °Symptoms of postpartum depression typically begin within the first month after giving birth. These symptoms include: °· Difficulty sleeping or excessive sleepiness.   °· Marked weight loss.   °· Agitation.   °· Feelings of worthlessness.   °· Lack of interest in activity or food.   °Postpartum psychosis is a very serious condition and can be dangerous. Fortunately, it is   rare. Displaying any of the following symptoms is cause for immediate medical attention. Symptoms of postpartum psychosis include:  °· Hallucinations and delusions.   °· Bizarre or disorganized behavior.   °· Confusion or disorientation.   °DIAGNOSIS  °A diagnosis is made by an evaluation of your symptoms. There are no medical or lab tests that lead to a diagnosis, but there are various questionnaires that a health care provider may use to identify those with the baby blues, postpartum depression, or psychosis. Often, a screening tool called the Edinburgh Postnatal Depression Scale is used to diagnose depression in the postpartum period.  °TREATMENT °The baby blues usually goes away on its own in 1-2 weeks. Social support is often all that is needed. You will be encouraged to get adequate sleep and rest. Occasionally, you may be given medicines to help you sleep.  °Postpartum depression requires treatment because it can last several months or longer if it is not treated. Treatment may include individual or group therapy, medicine, or both to address any social, physiological, and psychological factors  that may play a role in the depression. Regular exercise, a healthy diet, rest, and social support may also be strongly recommended.  °Postpartum psychosis is more serious and needs treatment right away. Hospitalization is often needed. °HOME CARE INSTRUCTIONS °· Get as much rest as you can. Nap when the baby sleeps.   °· Exercise regularly. Some women find yoga and walking to be beneficial.   °· Eat a balanced and nourishing diet.   °· Do little things that you enjoy. Have a cup of tea, take a bubble bath, read your favorite magazine, or listen to your favorite music. °· Avoid alcohol.   °· Ask for help with household chores, cooking, grocery shopping, or running errands as needed. Do not try to do everything.   °· Talk to people close to you about how you are feeling. Get support from your partner, family members, friends, or other new moms. °· Try to stay positive in how you think. Think about the things you are grateful for.   °· Do not spend a lot of time alone.   °· Only take over-the-counter or prescription medicine as directed by your health care provider. °· Keep all your postpartum appointments.   °· Let your health care provider know if you have any concerns.   °SEEK MEDICAL CARE IF: °You are having a reaction to or problems with your medicine. °SEEK IMMEDIATE MEDICAL CARE IF: °· You have suicidal feelings.   °· You think you may harm the baby or someone else. °MAKE SURE YOU: °· Understand these instructions. °· Will watch your condition. °· Will get help right away if you are not doing well or get worse. °  °This information is not intended to replace advice given to you by your health care provider. Make sure you discuss any questions you have with your health care provider. °  °Document Released: 11/21/2003 Document Revised: 02/21/2013 Document Reviewed: 11/28/2012 °Elsevier Interactive Patient Education ©2016 [employer] °Postpartum Care After Vaginal Delivery °After you deliver your newborn  (postpartum period), the usual stay in the hospital is 24-72 hours. If there were problems with your labor or delivery, or if you have other medical problems, you might be in the hospital longer.  °While you are in the hospital, you will receive help and instructions on how to care for yourself and your newborn during the postpartum period.  °While you are in the hospital: °· Be sure to tell your nurses if you have pain or discomfort, as well as   where you feel the pain and what makes the pain worse. °· If you had an incision made near your vagina (episiotomy) or if you had some tearing during delivery, the nurses may put ice packs on your episiotomy or tear. The ice packs may help to reduce the pain and swelling. °· If you are breastfeeding, you may feel uncomfortable contractions of your uterus for a couple of weeks. This is normal. The contractions help your uterus get back to normal size. °· It is normal to have some bleeding after delivery. °¨ For the first 1-3 days after delivery, the flow is red and the amount may be similar to a period. °¨ It is common for the flow to start and stop. °¨ In the first few days, you may pass some small clots. Let your nurses know if you begin to pass large clots or your flow increases. °¨ Do not  flush blood clots down the toilet before having the nurse look at them. °¨ During the next 3-10 days after delivery, your flow should become more watery and pink or brown-tinged in color. °¨ Ten to fourteen days after delivery, your flow should be a small amount of yellowish-white discharge. °¨ The amount of your flow will decrease over the first few weeks after delivery. Your flow may stop in 6-8 weeks. Most women have had their flow stop by 12 weeks after delivery. °· You should change your sanitary pads frequently. °· Wash your hands thoroughly with soap and water for at least 20 seconds after changing pads, using the toilet, or before holding or feeding your newborn. °· You should  feel like you need to empty your bladder within the first 6-8 hours after delivery. °· In case you become weak, lightheaded, or faint, call your nurse before you get out of bed for the first time and before you take a shower for the first time. °· Within the first few days after delivery, your breasts may begin to feel tender and full. This is called engorgement. Breast tenderness usually goes away within 48-72 hours after engorgement occurs. You may also notice milk leaking from your breasts. If you are not breastfeeding, do not stimulate your breasts. Breast stimulation can make your breasts produce more milk. °· Spending as much time as possible with your newborn is very important. During this time, you and your newborn can feel close and get to know each other. Having your newborn stay in your room (rooming in) will help to strengthen the bond with your newborn.  It will give you time to get to know your newborn and become comfortable caring for your newborn. °· Your hormones change after delivery. Sometimes the hormone changes can temporarily cause you to feel sad or tearful. These feelings should not last more than a few days. If these feelings last longer than that, you should talk to your caregiver. °· If desired, talk to your caregiver about methods of family planning or contraception. °· Talk to your caregiver about immunizations. Your caregiver may want you to have the following immunizations before leaving the hospital: °¨ Tetanus, diphtheria, and pertussis (Tdap) or tetanus and diphtheria (Td) immunization. It is very important that you and your family (including grandparents) or others caring for your newborn are up-to-date with the Tdap or Td immunizations. The Tdap or Td immunization can help protect your newborn from getting ill. °¨ Rubella immunization. °¨ Varicella (chickenpox) immunization. °¨ Influenza immunization. You should receive this annual immunization if you did not receive the    immunization during your pregnancy. °  °This information is not intended to replace advice given to you by your health care provider. Make sure you discuss any questions you have with your health care provider. °  °Document Released: 12/14/2006 Document Revised: 11/11/2011 Document Reviewed: 10/14/2011 °[employer]vier Interactive Patient Education ©2016 [employer] °Breastfeeding and Mastitis °Mastitis is inflammation of the breast tissue. It can occur in women who are breastfeeding. This can make breastfeeding painful. Mastitis will sometimes go away on its own. Your health care provider will help determine if treatment is needed. °CAUSES °Mastitis is often associated with a blocked milk (lactiferous) duct. This can happen when too much milk builds up in the breast. Causes of excess milk in the breast can include: °· Poor latch-on. If your baby is not latched onto the breast properly, she or he may not empty your breast completely while breastfeeding. °· Allowing too much time to pass between feedings. °· Wearing a bra or other clothing that is too tight. This puts extra pressure on the lactiferous ducts so milk does not flow through them as it should. °Mastitis can also be caused by a bacterial infection. Bacteria may enter the breast tissue through cuts or openings in the skin. In women who are breastfeeding, this may occur because of cracked or irritated skin. Cracks in the skin are often caused when your baby does not latch on properly to the breast. °SIGNS AND SYMPTOMS °· Swelling, redness, tenderness, and pain in an area of the breast. °· Swelling of the glands under the arm on the same side. °· Fever may or may not accompany mastitis. °If an infection is allowed to progress, a collection of pus (abscess) may develop. °DIAGNOSIS  °Your health care provider can usually diagnose mastitis based on your symptoms and a physical exam. Tests may be done to help confirm the diagnosis. These may include: °· Removal of pus  from the breast by applying pressure to the area. This pus can be examined in the lab to determine which bacteria are present. If an abscess has developed, the fluid in the abscess can be removed with a needle. This can also be used to confirm the diagnosis and determine the bacteria present. In most cases, pus will not be present. °· Blood tests to determine if your body is fighting a bacterial infection. °· Mammogram or ultrasound tests to rule out other problems or diseases. °TREATMENT  °Mastitis that occurs with breastfeeding will sometimes go away on its own. Your health care provider may choose to wait 24 hours after first seeing you to decide whether a prescription medicine is needed. If your symptoms are worse after 24 hours, your health care provider will likely prescribe an antibiotic medicine to treat the mastitis. He or she will determine which bacteria are most likely causing the infection and will then select an appropriate antibiotic medicine. This is sometimes changed based on the results of tests performed to identify the bacteria, or if there is no response to the antibiotic medicine selected. Antibiotic medicines are usually given by mouth. You may also be given medicine for pain. °HOME CARE INSTRUCTIONS °· Only take over-the-counter or prescription medicines for pain, fever, or discomfort as directed by your health care provider. °· If your health care provider prescribed an antibiotic medicine, take the medicine as directed. Make sure you finish it even if you start to feel better. °· Do not wear a tight or underwire bra. Wear a soft, supportive bra. °· Increase your fluid   intake, especially if you have a fever. °· Continue to empty the breast. Your health care provider can tell you whether this milk is safe for your infant or needs to be thrown out. You may be told to stop nursing until your health care provider thinks it is safe for your baby. Use a breast pump if you are advised to stop  nursing. °· Keep your nipples clean and dry. °· Empty the first breast completely before going to the other breast. If your baby is not emptying your breasts completely for some reason, use a breast pump to empty your breasts. °· If you go back to work, pump your breasts while at work to stay in time with your nursing schedule. °· Avoid allowing your breasts to become overly filled with milk (engorged). °SEEK MEDICAL CARE IF: °· You have pus-like discharge from the breast. °· Your symptoms do not improve with the treatment prescribed by your health care provider within 2 days. °SEEK IMMEDIATE MEDICAL CARE IF: °· Your pain and swelling are getting worse. °· You have pain that is not controlled with medicine. °· You have a red line extending from the breast toward your armpit. °· You have a fever or persistent symptoms for more than 2-3 days. °· You have a fever and your symptoms suddenly get worse. °MAKE SURE YOU:  °· Understand these instructions. °· Will watch your condition. °· Will get help right away if you are not doing well or get worse. °  °This information is not intended to replace advice given to you by your health care provider. Make sure you discuss any questions you have with your health care provider. °  °Document Released: 06/13/2004 Document Revised: 02/21/2013 Document Reviewed: 09/22/2012 °[employer]vier Interactive Patient Education ©2016 [employer] ° °Breastfeeding °Deciding to breastfeed is one of the best choices you can make for you and your baby. A change in hormones during pregnancy causes your breast tissue to grow and increases the number and size of your milk ducts. These hormones also allow proteins, sugars, and fats from your blood supply to make breast milk in your milk-producing glands. Hormones prevent breast milk from being released before your baby is born as well as prompt milk flow after birth. Once breastfeeding has begun, thoughts of your baby, as well as his or her sucking or  crying, can stimulate the release of milk from your milk-producing glands.  °BENEFITS OF BREASTFEEDING °For Your Baby °· Your first milk (colostrum) helps your baby's digestive system function better. °· There are antibodies in your milk that help your baby fight off infections. °· Your baby has a lower incidence of asthma, allergies, and sudden infant death syndrome. °· The nutrients in breast milk are better for your baby than infant formulas and are designed uniquely for your baby's needs. °· Breast milk improves your baby's brain development. °· Your baby is less likely to develop other conditions, such as childhood obesity, asthma, or type 2 diabetes mellitus. °For You °· Breastfeeding helps to create a very special bond between you and your baby. °· Breastfeeding is convenient. Breast milk is always available at the correct temperature and costs nothing. °· Breastfeeding helps to burn calories and helps you lose the weight gained during pregnancy. °· Breastfeeding makes your uterus contract to its prepregnancy size faster and slows bleeding (lochia) after you give birth.   °· Breastfeeding helps to lower your risk of developing type 2 diabetes mellitus, osteoporosis, and breast or ovarian cancer later in life. °  SIGNS THAT YOUR BABY IS HUNGRY °Early Signs of Hunger °· Increased alertness or activity. °· Stretching. °· Movement of the head from side to side. °· Movement of the head and opening of the mouth when the corner of the mouth or cheek is stroked (rooting). °· Increased sucking sounds, smacking lips, cooing, sighing, or squeaking. °· Hand-to-mouth movements. °· Increased sucking of fingers or hands. °Late Signs of Hunger °· Fussing. °· Intermittent crying. °Extreme Signs of Hunger °Signs of extreme hunger will require calming and consoling before your baby will be able to breastfeed successfully. Do not wait for the following signs of extreme hunger to occur before you initiate  breastfeeding: °· Restlessness. °· A loud, strong cry. °· Screaming. °BREASTFEEDING BASICS °Breastfeeding Initiation °· Find a comfortable place to sit or lie down, with your neck and back well supported. °· Place a pillow or rolled up blanket under your baby to bring him or her to the level of your breast (if you are seated). Nursing pillows are specially designed to help support your arms and your baby while you breastfeed. °· Make sure that your baby's abdomen is facing your abdomen. °· Gently massage your breast. With your fingertips, massage from your chest wall toward your nipple in a circular motion. This encourages milk flow. You may need to continue this action during the feeding if your milk flows slowly. °· Support your breast with 4 fingers underneath and your thumb above your nipple. Make sure your fingers are well away from your nipple and your baby's mouth. °· Stroke your baby's lips gently with your finger or nipple. °· When your baby's mouth is open wide enough, quickly bring your baby to your breast, placing your entire nipple and as much of the colored area around your nipple (areola) as possible into your baby's mouth. °¨ More areola should be visible above your baby's upper lip than below the lower lip. °¨ Your baby's tongue should be between his or her lower gum and your breast. °· Ensure that your baby's mouth is correctly positioned around your nipple (latched). Your baby's lips should create a seal on your breast and be turned out (everted). °· It is common for your baby to suck about 2-3 minutes in order to start the flow of breast milk. °Latching °Teaching your baby how to latch on to your breast properly is very important. An improper latch can cause nipple pain and decreased milk supply for you and poor weight gain in your baby. Also, if your baby is not latched onto your nipple properly, he or she may swallow some air during feeding. This can make your baby fussy. Burping your baby when  you switch breasts during the feeding can help to get rid of the air. However, teaching your baby to latch on properly is still the best way to prevent fussiness from swallowing air while breastfeeding. °Signs that your baby has successfully latched on to your nipple: °· Silent tugging or silent sucking, without causing you pain. °· Swallowing heard between every 3-4 sucks. °· Muscle movement above and in front of his or her ears while sucking. °Signs that your baby has not successfully latched on to nipple: °· Sucking sounds or smacking sounds from your baby while breastfeeding. °· Nipple pain. °If you think your baby has not latched on correctly, slip your finger into the corner of your baby's mouth to break the suction and place it between your baby's gums. Attempt breastfeeding initiation again. °Signs of Successful Breastfeeding °  Signs from your baby: °· A gradual decrease in the number of sucks or complete cessation of sucking. °· Falling asleep. °· Relaxation of his or her body. °· Retention of a small amount of milk in his or her mouth. °· Letting go of your breast by himself or herself. °Signs from you: °· Breasts that have increased in firmness, weight, and size 1-3 hours after feeding. °· Breasts that are softer immediately after breastfeeding. °· Increased milk volume, as well as a change in milk consistency and color by the fifth day of breastfeeding. °· Nipples that are not sore, cracked, or bleeding. °Signs That Your Baby is Getting Enough Milk °· Wetting at least 3 diapers in a 24-hour period. The urine should be clear and pale yellow by age 5 days. °· At least 3 stools in a 24-hour period by age 5 days. The stool should be soft and yellow. °· At least 3 stools in a 24-hour period by age 7 days. The stool should be seedy and yellow. °· No loss of weight greater than 10% of birth weight during the first 3 days of age. °· Average weight gain of 4-7 ounces (113-198 g) per week after age 4  days. °· Consistent daily weight gain by age 5 days, without weight loss after the age of 2 weeks. °After a feeding, your baby may spit up a small amount. This is common. °BREASTFEEDING FREQUENCY AND DURATION °Frequent feeding will help you make more milk and can prevent sore nipples and breast engorgement. Breastfeed when you feel the need to reduce the fullness of your breasts or when your baby shows signs of hunger. This is called "breastfeeding on demand." Avoid introducing a pacifier to your baby while you are working to establish breastfeeding (the first 4-6 weeks after your baby is born). After this time you may choose to use a pacifier. Research has shown that pacifier use during the first year of a baby's life decreases the risk of sudden infant death syndrome (SIDS). °Allow your baby to feed on each breast as long as he or she wants. Breastfeed until your baby is finished feeding. When your baby unlatches or falls asleep while feeding from the first breast, offer the second breast. Because newborns are often sleepy in the first few weeks of life, you may need to awaken your baby to get him or her to feed. °Breastfeeding times will vary from baby to baby. However, the following rules can serve as a guide to help you ensure that your baby is properly fed: °· Newborns (babies 4 weeks of age or younger) may breastfeed every 1-3 hours. °· Newborns should not go longer than 3 hours during the day or 5 hours during the night without breastfeeding. °· You should breastfeed your baby a minimum of 8 times in a 24-hour period until you begin to introduce solid foods to your baby at around 6 months of age. °BREAST MILK PUMPING °Pumping and storing breast milk allows you to ensure that your baby is exclusively fed your breast milk, even at times when you are unable to breastfeed. This is especially important if you are going back to work while you are still breastfeeding or when you are not able to be present during  feedings. Your lactation consultant can give you guidelines on how long it is safe to store breast milk. °A breast pump is a machine that allows you to pump milk from your breast into a sterile bottle. The pumped breast milk can   then be stored in a refrigerator or freezer. Some breast pumps are operated by hand, while others use electricity. Ask your lactation consultant which type will work best for you. Breast pumps can be purchased, but some hospitals and breastfeeding support groups lease breast pumps on a monthly basis. A lactation consultant can teach you how to hand express breast milk, if you prefer not to use a pump. °CARING FOR YOUR BREASTS WHILE YOU BREASTFEED °Nipples can become dry, cracked, and sore while breastfeeding. The following recommendations can help keep your breasts moisturized and healthy: °· Avoid using soap on your nipples. °· Wear a supportive bra. Although not required, special nursing bras and tank tops are designed to allow access to your breasts for breastfeeding without taking off your entire bra or top. Avoid wearing underwire-style bras or extremely tight bras. °· Air dry your nipples for 3-4 minutes after each feeding. °· Use only cotton bra pads to absorb leaked breast milk. Leaking of breast milk between feedings is normal. °· Use lanolin on your nipples after breastfeeding. Lanolin helps to maintain your skin's normal moisture barrier. If you use pure lanolin, you do not need to wash it off before feeding your baby again. Pure lanolin is not toxic to your baby. You may also hand express a few drops of breast milk and gently massage that milk into your nipples and allow the milk to air dry. °In the first few weeks after giving birth, some women experience extremely full breasts (engorgement). Engorgement can make your breasts feel heavy, warm, and tender to the touch. Engorgement peaks within 3-5 days after you give birth. The following recommendations can help ease  engorgement: °· Completely empty your breasts while breastfeeding or pumping. You may want to start by applying warm, moist heat (in the shower or with warm water-soaked hand towels) just before feeding or pumping. This increases circulation and helps the milk flow. If your baby does not completely empty your breasts while breastfeeding, pump any extra milk after he or she is finished. °· Wear a snug bra (nursing or regular) or tank top for 1-2 days to signal your body to slightly decrease milk production. °· Apply ice packs to your breasts, unless this is too uncomfortable for you. °· Make sure that your baby is latched on and positioned properly while breastfeeding. °If engorgement persists after 48 hours of following these recommendations, contact your health care provider or a lactation consultant. °OVERALL HEALTH CARE RECOMMENDATIONS WHILE BREASTFEEDING °· Eat healthy foods. Alternate between meals and snacks, eating 3 of each per day. Because what you eat affects your breast milk, some of the foods may make your baby more irritable than usual. Avoid eating these foods if you are sure that they are negatively affecting your baby. °· Drink milk, fruit juice, and water to satisfy your thirst (about 10 glasses a day). °· Rest often, relax, and continue to take your prenatal vitamins to prevent fatigue, stress, and anemia. °· Continue breast self-awareness checks. °· Avoid chewing and smoking tobacco. Chemicals from cigarettes that pass into breast milk and exposure to secondhand smoke may harm your baby. °· Avoid alcohol and drug use, including marijuana. °Some medicines that may be harmful to your baby can pass through breast milk. It is important to ask your health care provider before taking any medicine, including all over-the-counter and prescription medicine as well as vitamin and herbal supplements. °It is possible to become pregnant while breastfeeding. If birth control is desired, ask your health care    provider about options that will be safe for your baby. °SEEK MEDICAL CARE IF: °· You feel like you want to stop breastfeeding or have become frustrated with breastfeeding. °· You have painful breasts or nipples. °· Your nipples are cracked or bleeding. °· Your breasts are red, tender, or warm. °· You have a swollen area on either breast. °· You have a fever or chills. °· You have nausea or vomiting. °· You have drainage other than breast milk from your nipples. °· Your breasts do not become full before feedings by the fifth day after you give birth. °· You feel sad and depressed. °· Your baby is too sleepy to eat well. °· Your baby is having trouble sleeping.   °· Your baby is wetting less than 3 diapers in a 24-hour period. °· Your baby has less than 3 stools in a 24-hour period. °· Your baby's skin or the white part of his or her eyes becomes yellow.   °· Your baby is not gaining weight by 5 days of age. °SEEK IMMEDIATE MEDICAL CARE IF: °· Your baby is overly tired (lethargic) and does not want to wake up and feed. °· Your baby develops an unexplained fever. °  °This information is not intended to replace advice given to you by your health care provider. Make sure you discuss any questions you have with your health care provider. °  °Document Released: 02/16/2005 Document Revised: 11/07/2014 Document Reviewed: 08/10/2012 °Elsevier Interactive Patient Education ©2016 [employer] ° °

## 2015-12-10 NOTE — Progress Notes (Signed)
Patient ID: Kerry Potter, female   DOB: [birth year], 30 y.o.   MRN: [id number] Post Partum Day #2            Information for the patient's newborn:  Aahliyah, Gayton Terry [id number]  female  Feeding: breast  Subjective: No HA, SOB, CP, F/C, breast symptoms. Pain well-managed with Tylenol and ibuprofen. Normal vaginal bleeding, no clots.      Objective:  Temp:  [98.6 F (37 C)] 98.6 F (37 C) (10/09 1730) Pulse Rate:  [78] 78 (10/09 1730) Resp:  [18] 18 (10/09 1730) BP: (101)/(55) 101/55 (10/09 1730)  No intake or output data in the 24 hours ending 12/10/15 1136     Recent Labs  12/08/15 1619 12/09/15 0510  WBC 17.5* 14.9*  HGB 7.9* 7.5*  HCT 22.8* 22.1*  PLT 202 179    Blood type: O POS (10/07 1820) Rubella: Immune (03/22 0000)    Physical Exam:  General: alert, cooperative, fatigued and no distress Uterine Fundus: firm, midline, U-2 Lochia: appropriate Perineum: 2nd degree repair healing well, edema none DVT Evaluation: No evidence of DVT seen on physical exam. Negative Homan's sign. No cords or calf tenderness. No significant calf/ankle edema.    Assessment/Plan: PPD # 2 / 30 y.o., G1P1001 S/P: spontaneous vaginal   Principal Problem:   Postpartum care following vaginal delivery (10/8) Active Problems:   Postpartum state   SVD (spontaneous vaginal delivery)   Acute blood loss anemia   normal postpartum exam  Continue current postpartum care  D/C home  Start Niferex 150 mg BID and Magnesium Oxide 400 mg daily   LOS: 3 days   Laury Deep, M, MSN, CNM 12/10/2015, 10:42 AM

## 2015-12-10 NOTE — Discharge Summary (Signed)
OB Discharge Summary     Patient Name: Kerry Potter DOB: [birth year] MRN: [id number]  Date of admission: 12/07/2015 Delivering MD: Princess Bruins   Date of discharge: 12/10/2015  Admitting diagnosis: 38 WKS, WATER BROKE, Labor Intrauterine pregnancy: [redacted]w[redacted]d     Secondary diagnosis:  Principal Problem:   Postpartum care following vaginal delivery (10/8) Active Problems:   Postpartum state   SVD (spontaneous vaginal delivery)   Acute blood loss anemia  Additional problems: none     Discharge diagnosis: Term Pregnancy Delivered and [place]                                                                                                Post partum procedures:Blood transfusion offered - Patient refused  Augmentation: none  Complications: None  Hospital course:  Onset of Labor With Vaginal Delivery     30 y.o. yo G1P1001 at [redacted]w[redacted]d was admitted in Active Labor on 12/07/2015. Patient had an uncomplicated labor course as follows:  Membrane Rupture Time/Date: 2:30 PM ,12/07/2015   Intrapartum Procedures: Episiotomy: None [1]                                         Lacerations:  2nd degree [3]  Patient had a delivery of a Viable infant. 12/08/2015  Information for the patient's newborn:  Valri, Steidley Bostic [id number]  Delivery Method: Vag-Spont    Pateint had a complicated postpartum course with PPH and HgB drop from 11.3 to 7.5.  Patient had a few syncopal episodes in postpartum course.  She was offered a blood transfusion at that time, but refused.  Her condition did improve.  She wanted to delay iron supplementation until she had a BM. She is now ambulating, tolerating a regular diet, passing flatus, and urinating well. Patient is discharged home in stable condition on 12/10/15.    Physical exam  Vitals:   12/08/15 1711 12/08/15 1714 12/08/15 1716 12/09/15 1730  BP: (!) 119/55 107/62 (!) 122/58 (!) 101/55  Pulse: (!) 107 100 (!) 119 78  Resp: 18   18  Temp: 98.1 F (36.7 C)    98.6 F (37 C)  TempSrc: Oral   Oral  SpO2:      Weight:      Height:       General: alert, cooperative and no distress Lochia: appropriate Uterine Fundus: firm, midline, U-2 DVT Evaluation: No evidence of DVT seen on physical exam. Negative Homan's sign. No cords or calf tenderness. No significant calf/ankle edema.  Labs: Lab Results  Component Value Date   WBC 14.9 (H) 12/09/2015   HGB 7.5 (L) 12/09/2015   HCT 22.1 (L) 12/09/2015   MCV 82.8 12/09/2015   PLT 179 12/09/2015   No flowsheet data found.  Discharge instruction: per After Visit Summary and "Baby and Me Booklet".  After visit meds:    Medication List    TAKE these medications   acetaminophen 500 MG tablet Commonly known as:  TYLENOL Take 2 tablets (1,000 mg total)  by mouth every 6 (six) hours as needed for mild pain (for pain scale < 4).   ibuprofen 600 MG tablet Commonly known as:  ADVIL,MOTRIN Take 1 tablet (600 mg total) by mouth every 6 (six) hours as needed for moderate pain.   iron polysaccharides 150 MG capsule Commonly known as:  NIFEREX Take 1 capsule (150 mg total) by mouth 2 (two) times daily.   magnesium oxide 400 (241.3 Mg) MG tablet Commonly known as:  MAG-OX Take 1 tablet (400 mg total) by mouth daily.   prenatal multivitamin Tabs tablet Take 1 tablet by mouth at bedtime.   ranitidine 150 MG tablet Commonly known as:  ZANTAC Take 150 mg by mouth 2 (two) times daily as needed for heartburn.       Diet: routine diet  Activity: Advance as tolerated. Pelvic rest for 6 weeks.   Outpatient follow up:6 weeks Follow up Appt:No future appointments. Follow up Visit:No Follow-up on file.  Postpartum contraception: IUD Mirena  Newborn Data: Live born female on 12/08/2015 Birth Weight: 8 lb 10.1 oz (3915 g) APGAR: 8, 9  Baby Feeding: Breast Disposition:home with mother   12/10/2015 Laury Deep, Jerilynn Mages, CNM

## 2015-12-10 NOTE — Lactation Note (Signed)
This note was copied from a baby's chart. Lactation Consultation Note  Patient Name: Kerry Potter [id number] Date: 12/10/2015   Baby 4 hours old. Mom has note on door to check at nurses' station before entering. Checked and told fine to go in at this time. Mom has room full of visitors sitting quietly, but mom is sleeping soundly in bed. Left room quietly.   Maternal Data    Feeding Feeding Type: Bottle Fed - Breast Milk Nipple Type: Slow - flow Length of feed: 20 min  LATCH Score/Interventions                      Lactation Tools Discussed/Used Tools: Nipple [place] Fuel;Pump Nipple shield size: 24 Breast pump type: Double-Electric Breast Pump   Consult Status      Andres Labrum 12/10/2015, 1:45 PM

## 2015-12-11 ENCOUNTER — Ambulatory Visit: Payer: Self-pay

## 2015-12-11 NOTE — Lactation Note (Signed)
This note was copied from a baby's chart. Lactation Consultation Note: Mother bottle feeding when I entered the room. She states that infant fed for 30 min and she pumped 70ml. Mothers milk is coming in and feeling fuller. Discussed treatment to prevent engorgement . Advised mother to continue to breastfeed 8-12 times in 24 hours. Mother receptive to alll teaching. She was informed of lactation services and community support.   Patient Name: Kerry Potter [id number] Date: 12/11/2015 Reason for consult: Follow-up assessment   Maternal Data    Feeding Feeding Type: Breast Fed Nipple Type: Slow - flow Length of feed: 30 min (per mom)  LATCH Score/Interventions                      Lactation Tools Discussed/Used     Consult Status Consult Status: Complete    Darla Lesches 12/11/2015, 10:12 AM

## 2015-12-19 ENCOUNTER — Ambulatory Visit (HOSPITAL_COMMUNITY)
Admission: RE | Admit: 2015-12-19 | Discharge: 2015-12-19 | Disposition: A | Discharge status: Home / Self Care | Payer: BLUE CROSS/BLUE SHIELD | Source: Ambulatory Visit | Attending: Obstetrics & Gynecology | Admitting: Obstetrics & Gynecology

## 2015-12-19 ENCOUNTER — Ambulatory Visit (HOSPITAL_COMMUNITY): Admission: RE | Admit: 2015-12-19 | Payer: BLUE CROSS/BLUE SHIELD | Source: Ambulatory Visit

## 2015-12-19 NOTE — Lactation Note (Signed)
Lactation Consult - Baby girl  [place] , mom Kerry Potter , and dad Kerry Potter present at 9 am LC O/P appt.  Baby alert and awake , color pink, and calm , per parents last fed at 7 am form a bottle for EBM ( taking 3-4 oz )  At a feeding and tolerating well without spitting. Per mom baby has only been going to the breast 2 x's a day due to  Family staying with them, ( which they are leaving today). When Kerry Potter feeds it's for 30-45 mins. , 6-8 wets diapers a day , and 4-6 yellow stools. She has a diaper rash that is still red, but is much better , using the destin regularly with every diaper change.  Pumping with a DEBP Medela every 2-3 hours a day with 1 1/2 -3 oz EBM yield per breast. Per mom breast still somewhat full after pumping. In the last 3-4 days have only supplemented with EBM , prior to that [place]. From a Tommie Tippie Slow flow nipple which she tolerates well.  Per parents - baby is able to keep lips flanged at the base of the bottle. When she is  Getting a bottle for feeding - she can take 3-4 oz without spitting. Last weight check last Thursday Oct. 12 th at Dr. Darden Dates office ( see below )   Mother's reason for visit: F/U , also to help latch w/o NS , and to understand why she feeds for so long  Visit Type: Feeding assessment  Appointment Notes: using a NS / left a message 12/18/15  Consult:  Initial Lactation Consultant:  Kerry Potter  ________________________________________________________________________ Kerry Potter Name:  Kerry Potter Date of Birth:  12/08/2013 Pediatrician:  Dr. Marco Potter ( [facility]  Gender:  female Gestational Age: [redacted]w[redacted]d (At Birth) Birth Weight:  8 lb 10.1 oz (3915 g) Weight at Discharge:  Weight: 8 lb 1.3 oz (3665 g)               Date of Discharge:  12/11/2015      Filed Weights   12/08/15 2350 12/10/15 0315 12/11/15 0032  Weight: 8 lb 5 oz (3771 g) 7 lb 15.9 oz (3626 g) 8 lb 1.3 oz (3665 g)  Last weight taken from location outside of [facility]:  8-2 oz last Thursday 10/12    Location:Pediatrician's office Weight today: 8-13.3 oz 4006 g   ______________________________________________________________________  Mother's Name: Kerry Potter Type of delivery:  Vaginal Del;ivery  Breastfeeding Experience:  1st baby  Maternal Medical Conditions:  Anemia due to blood loss  Maternal Medications:  PNV , Iron, Magnesium, Motrin   ________________________________________________________________________  Breastfeeding History (Post Discharge) - see note above   ________________________________________________________________________  Maternal Breast Assessment  Breast:  Full Nipple:  Erect 0 semi compressible areolas due to fullness, expressed down and improved latch  Pain level:  2 - 3 at 1st and eased up  Pain interventions:  Expressed breast milk  _______________________________________________________________________ Feeding Assessment/Evaluation  Initial feeding assessment:  Infant's oral assessment:  High palate , otherwise good tongue mobility   Positioning:  Cross cradle Right breast   1st tried to [facility] without the NS , baby unable to sustain latch and #24 NS applied ( reviewed proper fitting with mom )   LATCH documentation:  Latch:  1 = Repeated attempts needed to sustain latch, nipple held in mouth throughout feeding, stimulation needed to elicit sucking reflex.  Audible swallowing:  2 = Spontaneous and intermittent  Type of  nipple:  2 = Everted at rest and after stimulation  Comfort (Breast/Nipple):  1 = Filling, red/small blisters or bruises, mild/mod discomfort  Hold (Positioning):  1 = Assistance needed to correctly position infant at breast and maintain latch  LATCH score:  7   Attached assessment:  Shallow @ 1st with the #24 NS, switched to the #20 NS for sizing and switch back to #24 NS   Lips flanged:  Yes.    Lips untucked:  No.  Suck assessment:  Nutritive and Nonnutritive  Tools:  Nipple  shield 24 mm Instructed on use and cleaning of tool:  Yes.    1st weight - 4006 g , 8-13.3 wet diaper changed and re - weight  Pre-feed weight:  3994 g , 8-12.9 oz  Post-feed weight:  4014 g , 8-13.6 oz  Amount transferred: 20 ml  Amount supplemented: none   Additional Feeding Assessment -   Infant's oral assessment:  High palate   Positioning:  Football Right breast  LATCH documentation:  Latch:  2 = Grasps breast easily, tongue down, lips flanged, rhythmical sucking.  Audible swallowing:  2 = Spontaneous and intermittent  Type of nipple:  2 = Everted at rest and after stimulation  Comfort (Breast/Nipple):  1 = Filling, red/small blisters or bruises, mild/mod discomfort  Hold (Positioning):  1 = Assistance needed to correctly position infant at breast and maintain latch  LATCH score:  8   Attached assessment:  Shallow @ 1st and easily flipped upper lip to flanged position   Lips flanged:  Yes.    Lips untucked:  No. - eased chin down   Suck assessment:  Nutritive  Tools:  Nipple shield 24 mm Instructed on use and cleaning of tool:   Pre-feed weight:  4014 g , 13.6 oz  Post-feed weight: 4038 g , 8-14.5 oz  Amount transferred:  24 ml  Amount supplemented:  None  Wet changed  - re- weight   Additional feeding : Additional feeding - latched on the left breast / football / with depth without the NS , and nipple appeared  normal when baby released.   Pre- feed weight: 4006 g , 8-13.3 oz  Post - feed weight: 4024 g , 8-13.9 oz  Amount transferred: 18 ml   Total amount pumped post feed: breast softened did not post pump   Total amount transferred: 62 ml  Total supplement given:  None   Lactation Impression: Mom presented with  Very full breast bilaterally,has been using a NS at home.  Only latching 2 x's a day . LC encouraged mom to give baby time at the breast so  She gets used to latching and it will be easier for her and will improve her let down.  Baby seemed to  better with the 2nd and 3rd latch, less fussy to latch and improved depth.  3rd latch - started out with the NS , baby fussy, and mom latched without the NS, with depth.  Cracking noted at  The base of both nipples - sore nipple tx in the [facility] plan.  See [place] for details below.    Lactation Plan Of Care: Option #1  Breast feed both breast - supplement after wards 1 oz if needed , if comfortable - don't need to post pump  Option #2  Breast feed 1st breast 15 -20 mins - soften well. Supplement , pump other breast 10 -15 mins  Option #3  If feeding is from a bottle -  pump both breast 15 -20 mins - protect established milk supply  Tips -Try #24 flange - see how it feels after sore ness improves at the base of both nipples  Comfort gels to nipples after feedings/ pumping  x6 days once opened.  Pre - pump or hand - express prior to latch so areola isn't to full. Areola needs to be compressible like a sandwich.  Try latching without the NIpple Shield ( since the baby latched the 3rd time with out the NS at the consult. - use if needed. #24  When breast are the fullest after feeding - pump off 10 mins - usually am , midday , and afternoon, and when necessary.

## 2016-10-12 ENCOUNTER — Ambulatory Visit: Payer: BLUE CROSS/BLUE SHIELD | Admitting: Family Medicine

## 2016-10-13 ENCOUNTER — Ambulatory Visit (INDEPENDENT_AMBULATORY_CARE_PROVIDER_SITE_OTHER): Payer: BLUE CROSS/BLUE SHIELD | Admitting: Family Medicine

## 2016-10-13 ENCOUNTER — Encounter: Payer: Self-pay | Admitting: Family Medicine

## 2016-10-13 VITALS — BP 90/70 | HR 76 | Temp 98.1°F | Ht 65.0 in | Wt 172.6 lb

## 2016-10-13 DIAGNOSIS — Z7689 Persons encountering health services in other specified circumstances: Secondary | ICD-10-CM

## 2016-10-13 DIAGNOSIS — G4489 Other headache syndrome: Secondary | ICD-10-CM | POA: Diagnosis not present

## 2016-10-13 NOTE — Progress Notes (Signed)
Subjective:    Patient ID: Kerry Potter, female    DOB: 1985-07-09, 31 y.o.   MRN: [id number]  No chief complaint on file.   HPI Patient is here today to establish care. Formally seen at [facility] in [facility] by Dr. Ishmael Holter.  Pt also seen by ob/gyn regularly.  Recently had a baby.  Currently on Mirena since January 2018, for birth control.  Pt not currently having any periods, only some recent spotting. Formerly used NuvaRing 10 years.  Patient endorses recent recurrent headaches. States has "lives with a HA". Last Tuesday patient was awakened from her sleep at 3:45 AM with headache.  Patient describes headache as a sharp pain with motion of head, dizziness, blurred vision. Patient notes light sensitivity and dizziness with bending over. Patient endorses trying over-the-counter meds with no relief including Tylenol Motrin Claritin-D. Patient denies nausea, vomiting, fever, chills.  Patient states she finally took a prescription of Gralise (gabapentin) 300 mg which provided some relief and allowed her to go to sleep.  Gralise was rx'd for Has, hot flashes, and anxiety.  SHx: Pt endorses drinking >64 oz of water per day.  Does drink some coffee in the am.  Does not drink sodas.  Pt denies tobacco use.  Endorses social EtOH use.  Endorses seasonal allergies in the spring, relived by Claritin D.  Pt currently works as a Financial trader.  Pt does endorse some stress at work and home.    Allergies: Mobic, causes diarrhea.  PSHx: -Removal of ovarian cysts at age 78.  FHx: Mother: Adopted history of ovarian cancer Father: Alive history of diabetes diet-controlled, hypertension, hyperlipidemia, prostate cancer Paternal grandfather: Diabetes, prostate cancer, hypertension, hyperlipidemia, skin and bladder cancer Sister: History of depression hypertension hyperlipidemia Brother: Alive and well     Past Medical History:  Diagnosis Date  . Acute blood loss anemia 12/10/2015  . Medical history  non-contributory     Past Surgical History:  Procedure Laterality Date  . NO PAST SURGERIES    . OVARIAN CYST REMOVAL Left 2004    Family History  Problem Relation Age of Onset  . Ovarian cancer Mother   . Hypertension Father   . Hypertension Paternal Grandmother   . Heart disease Paternal Grandfather   . Hypertension Paternal Grandfather   . Diabetes Paternal Grandfather     Social History   Social History  . Marital status: Married    Spouse name: N/A  . Number of children: N/A  . Years of education: N/A   Occupational History  . Not on file.   Social History Main Topics  . Smoking status: Never Smoker  . Smokeless tobacco: Never Used  . Alcohol use Yes     Comment: social  . Drug use: No  . Sexual activity: Yes    Birth control/ protection: IUD   Other Topics Concern  . Not on file   Social History Narrative  . No narrative on file    Outpatient Medications Prior to Visit  Medication Sig Dispense Refill  . acetaminophen (TYLENOL) 500 MG tablet Take 2 tablets (1,000 mg total) by mouth every 6 (six) hours as needed for mild pain (for pain scale < 4). 30 tablet 0  . ibuprofen (ADVIL,MOTRIN) 600 MG tablet Take 1 tablet (600 mg total) by mouth every 6 (six) hours as needed for moderate pain. 30 tablet 1  . iron polysaccharides (NIFEREX) 150 MG capsule Take 1 capsule (150 mg total) by mouth 2 (two) times daily.  60 capsule 3  . magnesium oxide (MAG-OX) 400 (241.3 Mg) MG tablet Take 1 tablet (400 mg total) by mouth daily. 30 tablet 3  . Prenatal Vit-Fe Fumarate-FA (PRENATAL MULTIVITAMIN) TABS tablet Take 1 tablet by mouth at bedtime.     . ranitidine (ZANTAC) 150 MG tablet Take 150 mg by mouth 2 (two) times daily as needed for heartburn.     No facility-administered medications prior to visit.     Allergies  Allergen Reactions  . Mobic [Meloxicam] Diarrhea    ROS  General: Denies fever, chills, night sweats, changes in weight, changes in appetite.  Patient  endorses hypotension have baseline, had screen shot of old vitals on phone. HEENT: Denies ear pain,  rhinorrhea, sore throat  Endorses HA at baseline and new onset HA last wk a/w dizziness and vision changes. CV: Denies CP, palpitations, SOB, orthopnea Pulm: Denies SOB, cough, wheezing GI: Denies abdominal pain, nausea, vomiting, diarrhea, constipation GU: Denies dysuria, hematuria, frequency, vaginal discharge Msk: Denies muscle cramps, joint pains Neuro: Denies weakness, numbness, tingling Skin: Denies rashes, bruising Psych: Denies depression, anxiety, hallucinations      Objective:    Blood pressure 90/70, pulse 76, temperature 98.1 F (36.7 C), temperature source Oral, height 5\' 5"  (1.651 m), weight 172 lb 9.6 oz (78.3 kg), last menstrual period 10/09/2016, not currently breastfeeding.   Gen. Pleasant, well-nourished, in no distress, normal affect   HEENT -[place]/AT, no lesions, face symmetric, no scleral icterus, PERRLA, EOMI, 1 beat vertical nystagmus to the right, Fundoscopic exam without papilledema, cotton wool spots or AV nicking. No post nasal drip,  Neck: No JVD, no thyromegaly, no carotid bruits Lungs: no accessory muscle uss, CTAB, no wheezes or rales Cardiovascular: RR, heart sounds  normal, no m/r/g, no peripheral edema Abdomen: soft and non-tender, no hepatosplenomegaly, BS normal. Musculoskeletal: No deformities, no cyanosis or clubbing, normal tone.  Normal strength throughout. Neuro:  A&Ox3, CN II-XII intact, normal gait Skin:  Warm, no lesions/ rash   Wt Readings from Last 3 Encounters:  10/13/16 172 lb 9.6 oz (78.3 kg)  12/07/15 205 lb (93 kg)    Diabetic Foot Exam - Simple   No data filed     Lab Results  Component Value Date   WBC 14.9 (H) 12/09/2015   HGB 7.5 (L) 12/09/2015   HCT 22.1 (L) 12/09/2015   PLT 179 12/09/2015    Assessment/Plan: Encounter to establish care with new doctor -Pt signed records release form.  Other headache syndrome -  Headache at baseline with new onset Headache different from normal.  H/o birth control use.  Fundoscopic exam and strength normal.  Given the above will obtain CT head and labs to r/o underlying HA causes.  -Discussed red flag HA signs with pt.  Advised if experiences new or worsening symptoms to RTC or proceed to the ED. -Discussed keeping a HA diary.  -Plan: CT Head Wo Contrast, CBC with Differential/Platelet, Hepatic function panel, TSH, T4, Free, CBC with Differential/Platelet, Hepatic function panel, TSH, T4, Free -RTC in the next few wks, sooner if needed.

## 2016-10-13 NOTE — Patient Instructions (Addendum)
Migraine Headache A migraine headache is an intense, throbbing pain on one side or both sides of the head. Migraines may also cause other symptoms, such as nausea, vomiting, and sensitivity to light and noise. What are the causes? Doing or taking certain things may also trigger migraines, such as:  Alcohol.  Smoking.  Medicines, such as: ? Medicine used to treat chest pain (nitroglycerine). ? Birth control pills. ? Estrogen pills. ? Certain blood pressure medicines.  Aged cheeses, chocolate, or caffeine.  Foods or drinks that contain nitrates, glutamate, aspartame, or tyramine.  Physical activity.  Other things that may trigger a migraine include:  Menstruation.  Pregnancy.  Hunger.  Stress, lack of sleep, too much sleep, or fatigue.  Weather changes.  What increases the risk? The following factors may make you more likely to experience migraine headaches:  Age. Risk increases with age.  Family history of migraine headaches.  Being Caucasian.  Depression and anxiety.  Obesity.  Being a woman.  Having a hole in the heart (patent foramen ovale) or other heart problems.  What are the signs or symptoms? The main symptom of this condition is pulsating or throbbing pain. Pain may:  Happen in any area of the head, such as on one side or both sides.  Interfere with daily activities.  Get worse with physical activity.  Get worse with exposure to bright lights or loud noises.  Other symptoms may include:  Nausea.  Vomiting.  Dizziness.  General sensitivity to bright lights, loud noises, or smells.  Before you get a migraine, you may get warning signs that a migraine is developing (aura). An aura may include:  Seeing flashing lights or having blind spots.  Seeing bright spots, halos, or zigzag lines.  Having tunnel vision or blurred vision.  Having numbness or a tingling feeling.  Having trouble talking.  Having muscle weakness.  How is this  diagnosed? A migraine headache can be diagnosed based on:  Your symptoms.  A physical exam.  Tests, such as CT scan or MRI of the head. These imaging tests can help rule out other causes of headaches.  Taking fluid from the spine (lumbar puncture) and analyzing it (cerebrospinal fluid analysis, or CSF analysis).  How is this treated? A migraine headache is usually treated with medicines that:  Relieve pain.  Relieve nausea.  Prevent migraines from coming back.  Treatment may also include:  Acupuncture.  Lifestyle changes like avoiding foods that trigger migraines.  Follow these instructions at home: Medicines  Take over-the-counter and prescription medicines only as told by your health care provider.  Do not drive or use heavy machinery while taking prescription pain medicine.  To prevent or treat constipation while you are taking prescription pain medicine, your health care provider may recommend that you: ? Drink enough fluid to keep your urine clear or pale yellow. ? Take over-the-counter or prescription medicines. ? Eat foods that are high in fiber, such as fresh fruits and vegetables, whole grains, and beans. ? Limit foods that are high in fat and processed sugars, such as fried and sweet foods. Lifestyle  Avoid alcohol use.  Do not use any products that contain nicotine or tobacco, such as cigarettes and e-cigarettes. If you need help quitting, ask your health care provider.  Get at least 8 hours of sleep every night.  Limit your stress. General instructions   Keep a journal to find out what may trigger your migraine headaches. For example, write down: ? What you eat and   drink. ? How much sleep you get. ? Any change to your diet or medicines.  If you have a migraine: ? Avoid things that make your symptoms worse, such as bright lights. ? It may help to lie down in a dark, quiet room. ? Do not drive or use heavy machinery. ? Ask your health care provider  what activities are safe for you while you are experiencing symptoms.  Keep all follow-up visits as told by your health care provider. This is important. Contact a health care provider if:  You develop symptoms that are different or more severe than your usual migraine symptoms. Get help right away if:  Your migraine becomes severe.  You have a fever.  You have a stiff neck.  You have vision loss.  Your muscles feel weak or like you cannot control them.  You start to lose your balance often.  You develop trouble walking.  You faint. This information is not intended to replace advice given to you by your health care provider. Make sure you discuss any questions you have with your health care provider. Document Released: 02/16/2005 Document Revised: 09/06/2015 Document Reviewed: 08/05/2015 Elsevier Interactive Patient Education  2017 [place].  Please contact the office or proceed to the ED if your headache symptoms become worse.    As discussed please try to keep a Headache diary.  Be as descriptive as possible.

## 2016-10-14 ENCOUNTER — Ambulatory Visit (INDEPENDENT_AMBULATORY_CARE_PROVIDER_SITE_OTHER)
Admission: RE | Admit: 2016-10-14 | Discharge: 2016-10-14 | Disposition: A | Discharge status: Home / Self Care | Payer: BLUE CROSS/BLUE SHIELD | Source: Ambulatory Visit | Attending: Family Medicine | Admitting: Family Medicine

## 2016-10-14 DIAGNOSIS — G4489 Other headache syndrome: Secondary | ICD-10-CM | POA: Diagnosis not present

## 2016-10-14 LAB — HEPATIC FUNCTION PANEL
ALBUMIN: 4.4 g/dL (ref 3.5–5.2)
ALT: 34 U/L (ref 0–35)
AST: 18 U/L (ref 0–37)
Alkaline Phosphatase: 59 U/L (ref 39–117)
BILIRUBIN TOTAL: 0.2 mg/dL (ref 0.2–1.2)
Bilirubin, Direct: 0 mg/dL (ref 0.0–0.3)
Total Protein: 6.7 g/dL (ref 6.0–8.3)

## 2016-10-14 LAB — CBC WITH DIFFERENTIAL/PLATELET
BASOS PCT: 1.1 % (ref 0.0–3.0)
Basophils Absolute: 0.1 10*3/uL (ref 0.0–0.1)
EOS ABS: 0.2 10*3/uL (ref 0.0–0.7)
Eosinophils Relative: 2.6 % (ref 0.0–5.0)
HCT: 39.3 % (ref 36.0–46.0)
HEMOGLOBIN: 13.1 g/dL (ref 12.0–15.0)
Lymphocytes Relative: 36.8 % (ref 12.0–46.0)
Lymphs Abs: 3.1 10*3/uL (ref 0.7–4.0)
MCHC: 33.2 g/dL (ref 30.0–36.0)
MCV: 88.6 fl (ref 78.0–100.0)
MONO ABS: 0.6 10*3/uL (ref 0.1–1.0)
Monocytes Relative: 6.9 % (ref 3.0–12.0)
NEUTROS ABS: 4.4 10*3/uL (ref 1.4–7.7)
Neutrophils Relative %: 52.6 % (ref 43.0–77.0)
PLATELETS: 308 10*3/uL (ref 150.0–400.0)
RBC: 4.43 Mil/uL (ref 3.87–5.11)
RDW: 13.3 % (ref 11.5–15.5)
WBC: 8.4 10*3/uL (ref 4.0–10.5)

## 2016-10-14 LAB — TSH: TSH: 0.68 u[IU]/mL (ref 0.35–4.50)

## 2016-10-14 LAB — T4, FREE: Free T4: 0.89 ng/dL (ref 0.60–1.60)

## 2016-10-15 ENCOUNTER — Telehealth: Payer: Self-pay | Admitting: Family Medicine

## 2016-10-15 NOTE — Telephone Encounter (Signed)
Pt called regarding CT head results for HA that awakened her from sleep. CT without any acute intercranial findings or mass lesions.  Sphenoid and L ethmoid sinus dz present.  Pt will start Flonase nasal spray.  Offered pt a rx, but she states she will get OTC.

## 2016-11-19 ENCOUNTER — Encounter: Payer: Self-pay | Admitting: Family Medicine

## 2017-03-02 NOTE — L&D Delivery Note (Addendum)
Delivery Note Patient used nitrous oxide for analgesia.  At 5:33 AM a viable and healthy female was delivered via Vaginal, Spontaneous (Presentation: OA ).  APGAR: 9 9 , ; weight  pending Placenta status: spontaneous and complete.  Cord: normal.  Cord pH: NA  Anesthesia:  None Lacerations:  2nd degree perineal  Suture Repair: 3.0 vicryl rapide Est. Blood Loss (mL):  968 cc  --> Methergine 0.2 mg IM given. Bleeding from delayed placental expulsion and perineal tear Pt received Fentanyl 185mcg IV before repair.   Mom to postpartum.  Baby to Couplet care / Skin to Skin.  Kerry Potter 12/06/2017, 6:14 AM

## 2017-04-09 DIAGNOSIS — Z3201 Encounter for pregnancy test, result positive: Secondary | ICD-10-CM | POA: Diagnosis not present

## 2017-05-20 DIAGNOSIS — Z3481 Encounter for supervision of other normal pregnancy, first trimester: Secondary | ICD-10-CM | POA: Diagnosis not present

## 2017-05-20 DIAGNOSIS — Z3689 Encounter for other specified antenatal screening: Secondary | ICD-10-CM | POA: Diagnosis not present

## 2017-05-20 DIAGNOSIS — Z3491 Encounter for supervision of normal pregnancy, unspecified, first trimester: Secondary | ICD-10-CM | POA: Diagnosis not present

## 2017-05-20 DIAGNOSIS — Z36 Encounter for antenatal screening for chromosomal anomalies: Secondary | ICD-10-CM | POA: Diagnosis not present

## 2017-05-20 LAB — OB RESULTS CONSOLE RUBELLA ANTIBODY, IGM: Rubella: IMMUNE

## 2017-05-20 LAB — OB RESULTS CONSOLE ABO/RH: RH Type: POSITIVE

## 2017-05-20 LAB — OB RESULTS CONSOLE HIV ANTIBODY (ROUTINE TESTING): HIV: NONREACTIVE

## 2017-05-20 LAB — OB RESULTS CONSOLE RPR: RPR: NONREACTIVE

## 2017-05-20 LAB — OB RESULTS CONSOLE HEPATITIS B SURFACE ANTIGEN: Hepatitis B Surface Ag: NEGATIVE

## 2017-06-04 DIAGNOSIS — Z3689 Encounter for other specified antenatal screening: Secondary | ICD-10-CM | POA: Diagnosis not present

## 2017-06-04 DIAGNOSIS — Z3682 Encounter for antenatal screening for nuchal translucency: Secondary | ICD-10-CM | POA: Diagnosis not present

## 2017-06-04 DIAGNOSIS — Z118 Encounter for screening for other infectious and parasitic diseases: Secondary | ICD-10-CM | POA: Diagnosis not present

## 2017-06-04 LAB — OB RESULTS CONSOLE GC/CHLAMYDIA
CHLAMYDIA, DNA PROBE: NEGATIVE
Gonorrhea: NEGATIVE

## 2017-06-30 ENCOUNTER — Encounter: Payer: Self-pay | Admitting: Family Medicine

## 2017-06-30 ENCOUNTER — Ambulatory Visit (INDEPENDENT_AMBULATORY_CARE_PROVIDER_SITE_OTHER): Payer: 59 | Admitting: Family Medicine

## 2017-06-30 VITALS — BP 132/54 | HR 124 | Temp 97.9°F | Wt 177.9 lb

## 2017-06-30 DIAGNOSIS — J019 Acute sinusitis, unspecified: Secondary | ICD-10-CM | POA: Diagnosis not present

## 2017-06-30 DIAGNOSIS — Z0000 Encounter for general adult medical examination without abnormal findings: Secondary | ICD-10-CM | POA: Diagnosis not present

## 2017-06-30 DIAGNOSIS — Z3A16 16 weeks gestation of pregnancy: Secondary | ICD-10-CM | POA: Diagnosis not present

## 2017-06-30 MED ORDER — AMOXICILLIN 500 MG PO CAPS
500.0000 mg | ORAL_CAPSULE | Freq: Two times a day (BID) | ORAL | 0 refills | Status: AC
Start: 2017-06-30 — End: 2017-07-07

## 2017-06-30 NOTE — Progress Notes (Signed)
Subjective:     Kerry Potter is a 32 y.o. female and is here for a comprehensive physical exam. The patient reports problems - sinusitis.  Patient endorses sinus congestion, cough, facial pressure, head pressure which is worse at night x1 week.  Patient has tried Claritin-D, Mucinex, Flonase for her symptoms.  Patient states she typically gets sinus infections around this time each year.  Of note: pt is [redacted] weeks pregnant.  EDD October 2019. Pt denies dysuria, back pain, edema in hands and feet, abdominal pain, vaginal discharge.  Patient is taking prenatal vitamins when she is not experiencing nausea and vomiting.  Patient has not currently taking anything for nausea vomiting.  She has tried the wrist pressure bands with little relief.  Pt has also tried melatonin with little relief.  Social History   Socioeconomic History  . Marital status: Married    Spouse name: Not on file  . Number of children: Not on file  . Years of education: Not on file  . Highest education level: Not on file  Occupational History  . Not on file  Social Needs  . Financial resource strain: Not on file  . Food insecurity:    Worry: Not on file    Inability: Not on file  . Transportation needs:    Medical: Not on file    Non-medical: Not on file  Tobacco Use  . Smoking status: Never Smoker  . Smokeless tobacco: Never Used  Substance and Sexual Activity  . Alcohol use: Yes    Comment: social  . Drug use: No  . Sexual activity: Yes    Birth control/protection: IUD  Lifestyle  . Physical activity:    Days per week: Not on file    Minutes per session: Not on file  . Stress: Not on file  Relationships  . Social connections:    Talks on phone: Not on file    Gets together: Not on file    Attends religious service: Not on file    Active member of club or organization: Not on file    Attends meetings of clubs or organizations: Not on file    Relationship status: Not on file  . Intimate partner violence:    Fear of current or ex partner: Not on file    Emotionally abused: Not on file    Physically abused: Not on file    Forced sexual activity: Not on file  Other Topics Concern  . Not on file  Social History Narrative  . Not on file   Health Maintenance  Topic Date Due  . TETANUS/TDAP  03/06/2006  . PAP SMEAR  03/06/2008  . INFLUENZA VACCINE  09/30/2017  . HIV Screening  Completed    The following portions of the patient's history were reviewed and updated as appropriate: allergies, current medications, past family history, past medical history, past social history, past surgical history and problem list.  Review of Systems A comprehensive review of systems was negative.   Objective:    BP (!) 132/54 (BP Location: Left Arm, Patient Position: Sitting, Cuff Size: Normal)   Pulse (!) 124   Temp 97.9 F (36.6 C) (Oral)   Wt 177 lb 14.4 oz (80.7 kg)   SpO2 98%   BMI 29.60 kg/m  General appearance: alert, cooperative and no distress Head: Normocephalic, without obvious abnormality, atraumatic Eyes: conjunctivae/corneas clear. PERRL, EOM's intact. Fundi benign. Ears: TMs b/l full. Nose: Nares normal. Septum midline. Mucosa normal. No drainage or sinus tenderness. Throat:  lips, mucosa, and tongue normal; teeth and gums normal Neck: no adenopathy, no JVD, supple, symmetrical, trachea midline and thyroid not enlarged, symmetric, no tenderness/mass/nodules Lungs: clear to auscultation bilaterally Heart: regular rate and rhythm, S1, S2 normal, no murmur, click, rub or gallop Abdomen: soft, non-tender; bowel sounds normal; no masses,  no organomegaly and gravid. Pulses: 2+ and symmetric Skin: Skin color, texture, turgor normal. No rashes or lesions Neurologic: Alert and oriented X 3, normal strength and tone. Normal symmetric reflexes. Normal coordination and gait    Assessment:    Healthy female exam, [redacted] weeks pregnant.       Plan:      Anticipatory guidance given including  wearing seatbelts, smoke detectors in the home, increasing p.o. intake of water, increasing p.o. intake of vegetables, increasing physical activity -Next CPE in 1 year -Labs deferred at this time See After Visit Summary for Counseling Recommendations   Acute sinusitis -Amoxicillin 500 mg twice daily x7 days -Patient encouraged to continue allergy medicine such as Claritin, Allegra, Zyrtec without the decongestant given pregnancy. -Patient also  encouraged to consider trying saline nasal rinse or spray.  Pregnancy -Patient 16 weeks -Patient encouraged to increase p.o. intake of water -Patient encouraged to try doxylamine 12.5 mg and pyridoxine 25 mg 3 times daily as needed for nausea and vomiting -Continue following with OB/GYN  Follow-up PRN Grier Mitts, MD

## 2017-06-30 NOTE — Patient Instructions (Addendum)
Regular Claritin, Zyrtec, or Allegra are safe to take while pregnant.  Consider using a saline nasal spray for your allergy symptoms.  Preventive Care 18-39 Years, Female Preventive care refers to lifestyle choices and visits with your health care provider that can promote health and wellness. What does preventive care include?  A yearly physical exam. This is also called an annual well check.  Dental exams once or twice a year.  Routine eye exams. Ask your health care provider how often you should have your eyes checked.  Personal lifestyle choices, including: ? Daily care of your teeth and gums. ? Regular physical activity. ? Eating a healthy diet. ? Avoiding tobacco and drug use. ? Limiting alcohol use. ? Practicing safe sex. ? Taking vitamin and mineral supplements as recommended by your health care provider. What happens during an annual well check? The services and screenings done by your health care provider during your annual well check will depend on your age, overall health, lifestyle risk factors, and family history of disease. Counseling Your health care provider may ask you questions about your:  Alcohol use.  Tobacco use.  Drug use.  Emotional well-being.  Home and relationship well-being.  Sexual activity.  Eating habits.  Work and work Statistician.  Method of birth control.  Menstrual cycle.  Pregnancy history.  Screening You may have the following tests or measurements:  Height, weight, and BMI.  Diabetes screening. This is done by checking your blood sugar (glucose) after you have not eaten for a while (fasting).  Blood pressure.  Lipid and cholesterol levels. These may be checked every 5 years starting at age 15.  Skin check.  Hepatitis C blood test.  Hepatitis B blood test.  Sexually transmitted disease (STD) testing.  BRCA-related cancer screening. This may be done if you have a family history of breast, ovarian, tubal, or  peritoneal cancers.  Pelvic exam and Pap test. This may be done every 3 years starting at age 12. Starting at age 52, this may be done every 5 years if you have a Pap test in combination with an HPV test.  Discuss your test results, treatment options, and if necessary, the need for more tests with your health care provider. Vaccines Your health care provider may recommend certain vaccines, such as:  Influenza vaccine. This is recommended every year.  Tetanus, diphtheria, and acellular pertussis (Tdap, Td) vaccine. You may need a Td booster every 10 years.  Varicella vaccine. You may need this if you have not been vaccinated.  HPV vaccine. If you are 51 or younger, you may need three doses over 6 months.  Measles, mumps, and rubella (MMR) vaccine. You may need at least one dose of MMR. You may also need a second dose.  Pneumococcal 13-valent conjugate (PCV13) vaccine. You may need this if you have certain conditions and were not previously vaccinated.  Pneumococcal polysaccharide (PPSV23) vaccine. You may need one or two doses if you smoke cigarettes or if you have certain conditions.  Meningococcal vaccine. One dose is recommended if you are age 62-21 years and a first-year college student living in a residence hall, or if you have one of several medical conditions. You may also need additional booster doses.  Hepatitis A vaccine. You may need this if you have certain conditions or if you travel or work in places where you may be exposed to hepatitis A.  Hepatitis B vaccine. You may need this if you have certain conditions or if you travel or  work in places where you may be exposed to hepatitis B.  Haemophilus influenzae type b (Hib) vaccine. You may need this if you have certain risk factors.  Talk to your health care provider about which screenings and vaccines you need and how often you need them. This information is not intended to replace advice given to you by your health care  provider. Make sure you discuss any questions you have with your health care provider. Document Released: 04/14/2001 Document Revised: 11/06/2015 Document Reviewed: 12/18/2014 Elsevier Interactive Patient Education  2018 [employer].  Sinusitis, Adult Sinusitis is soreness and inflammation of your sinuses. Sinuses are hollow spaces in the bones around your face. They are located:  Around your eyes.  In the middle of your forehead.  Behind your nose.  In your cheekbones.  Your sinuses and nasal passages are lined with a stringy fluid (mucus). Mucus normally drains out of your sinuses. When your nasal tissues get inflamed or swollen, the mucus can get trapped or blocked so air cannot flow through your sinuses. This lets bacteria, viruses, and funguses grow, and that leads to infection. Follow these instructions at home: Medicines  Take, use, or apply over-the-counter and prescription medicines only as told by your doctor. These may include nasal sprays.  If you were prescribed an antibiotic medicine, take it as told by your doctor. Do not stop taking the antibiotic even if you start to feel better. Hydrate and Humidify  Drink enough water to keep your pee (urine) clear or pale yellow.  Use a cool mist humidifier to keep the humidity level in your home above 50%.  Breathe in steam for 10-15 minutes, 3-4 times a day or as told by your doctor. You can do this in the bathroom while a hot shower is running.  Try not to spend time in cool or dry air. Rest  Rest as much as possible.  Sleep with your head raised (elevated).  Make sure to get enough sleep each night. General instructions  Put a warm, moist washcloth on your face 3-4 times a day or as told by your doctor. This will help with discomfort.  Wash your hands often with soap and water. If there is no soap and water, use hand sanitizer.  Do not smoke. Avoid being around people who are smoking (secondhand smoke).  Keep  all follow-up visits as told by your doctor. This is important. Contact a doctor if:  You have a fever.  Your symptoms get worse.  Your symptoms do not get better within 10 days. Get help right away if:  You have a very bad headache.  You cannot stop throwing up (vomiting).  You have pain or swelling around your face or eyes.  You have trouble seeing.  You feel confused.  Your neck is stiff.  You have trouble breathing. This information is not intended to replace advice given to you by your health care provider. Make sure you discuss any questions you have with your health care provider. Document Released: 08/05/2007 Document Revised: 10/13/2015 Document Reviewed: 12/12/2014 Elsevier Interactive Patient Education  [employer].

## 2017-07-02 DIAGNOSIS — Z361 Encounter for antenatal screening for raised alphafetoprotein level: Secondary | ICD-10-CM | POA: Diagnosis not present

## 2017-07-21 DIAGNOSIS — Z363 Encounter for antenatal screening for malformations: Secondary | ICD-10-CM | POA: Diagnosis not present

## 2017-08-03 DIAGNOSIS — O4402 Placenta previa specified as without hemorrhage, second trimester: Secondary | ICD-10-CM | POA: Diagnosis not present

## 2017-08-03 DIAGNOSIS — Z3A20 20 weeks gestation of pregnancy: Secondary | ICD-10-CM | POA: Diagnosis not present

## 2017-09-23 DIAGNOSIS — Z23 Encounter for immunization: Secondary | ICD-10-CM | POA: Diagnosis not present

## 2017-09-23 DIAGNOSIS — Z3A28 28 weeks gestation of pregnancy: Secondary | ICD-10-CM | POA: Diagnosis not present

## 2017-09-23 DIAGNOSIS — Z3689 Encounter for other specified antenatal screening: Secondary | ICD-10-CM | POA: Diagnosis not present

## 2017-09-23 DIAGNOSIS — O4443 Low lying placenta NOS or without hemorrhage, third trimester: Secondary | ICD-10-CM | POA: Diagnosis not present

## 2017-10-22 DIAGNOSIS — O4443 Low lying placenta NOS or without hemorrhage, third trimester: Secondary | ICD-10-CM | POA: Diagnosis not present

## 2017-10-22 DIAGNOSIS — Z3A32 32 weeks gestation of pregnancy: Secondary | ICD-10-CM | POA: Diagnosis not present

## 2017-11-19 DIAGNOSIS — Z3685 Encounter for antenatal screening for Streptococcus B: Secondary | ICD-10-CM | POA: Diagnosis not present

## 2017-11-19 LAB — OB RESULTS CONSOLE GBS: STREP GROUP B AG: NEGATIVE

## 2017-12-06 ENCOUNTER — Encounter (HOSPITAL_COMMUNITY): Payer: Self-pay | Admitting: *Deleted

## 2017-12-06 ENCOUNTER — Other Ambulatory Visit: Payer: Self-pay

## 2017-12-06 ENCOUNTER — Inpatient Hospital Stay (HOSPITAL_COMMUNITY)
Admission: AD | Admit: 2017-12-06 | Discharge: 2017-12-07 | DRG: 806 | Disposition: A | Discharge status: Home / Self Care | Payer: 59 | Attending: Obstetrics & Gynecology | Admitting: Obstetrics & Gynecology

## 2017-12-06 DIAGNOSIS — Z3483 Encounter for supervision of other normal pregnancy, third trimester: Secondary | ICD-10-CM | POA: Diagnosis present

## 2017-12-06 DIAGNOSIS — O9081 Anemia of the puerperium: Secondary | ICD-10-CM | POA: Diagnosis not present

## 2017-12-06 DIAGNOSIS — D62 Acute posthemorrhagic anemia: Secondary | ICD-10-CM | POA: Diagnosis not present

## 2017-12-06 DIAGNOSIS — Z3A38 38 weeks gestation of pregnancy: Secondary | ICD-10-CM

## 2017-12-06 LAB — CBC
HEMATOCRIT: 39.6 % (ref 36.0–46.0)
HEMOGLOBIN: 13.2 g/dL (ref 12.0–15.0)
MCH: 29.6 pg (ref 26.0–34.0)
MCHC: 33.3 g/dL (ref 30.0–36.0)
MCV: 88.8 fL (ref 78.0–100.0)
Platelets: 193 10*3/uL (ref 150–400)
RBC: 4.46 MIL/uL (ref 3.87–5.11)
RDW: 14 % (ref 11.5–15.5)
WBC: 10.5 10*3/uL (ref 4.0–10.5)

## 2017-12-06 LAB — TYPE AND SCREEN
ABO/RH(D): O POS
Antibody Screen: NEGATIVE

## 2017-12-06 LAB — RPR: RPR Ser Ql: NONREACTIVE

## 2017-12-06 MED ORDER — METHYLERGONOVINE MALEATE 0.2 MG/ML IJ SOLN: 0.2000 mg | INTRAMUSCULAR | Status: DC | PRN

## 2017-12-06 MED ORDER — OXYTOCIN 10 UNIT/ML IJ SOLN
INTRAMUSCULAR | Status: AC
  Filled 2017-12-06: qty 1

## 2017-12-06 MED ORDER — FERROUS SULFATE 325 (65 FE) MG PO TABS
325.0000 mg | ORAL_TABLET | Freq: Every day | ORAL | Status: DC
  Administered 2017-12-07: 325 mg via ORAL
  Filled 2017-12-06: qty 1

## 2017-12-06 MED ORDER — METHYLERGONOVINE MALEATE 0.2 MG/ML IJ SOLN: 0.2000 mg | INTRAMUSCULAR | Status: DC

## 2017-12-06 MED ORDER — TETANUS-DIPHTH-ACELL PERTUSSIS 5-2.5-18.5 LF-MCG/0.5 IM SUSP: 0.5000 mL | Freq: Once | INTRAMUSCULAR | Status: DC

## 2017-12-06 MED ORDER — FENTANYL CITRATE (PF) 100 MCG/2ML IJ SOLN
INTRAMUSCULAR | Status: AC
  Administered 2017-12-06: 100 ug via INTRAVENOUS
  Filled 2017-12-06: qty 2

## 2017-12-06 MED ORDER — PRENATAL MULTIVITAMIN CH
1.0000 | ORAL_TABLET | Freq: Every day | ORAL | Status: DC
  Administered 2017-12-06 – 2017-12-07 (×2): 1 via ORAL
  Filled 2017-12-06 (×2): qty 1

## 2017-12-06 MED ORDER — ONDANSETRON HCL 4 MG/2ML IJ SOLN: 4.0000 mg | Freq: Four times a day (QID) | INTRAMUSCULAR | Status: DC | PRN

## 2017-12-06 MED ORDER — IBUPROFEN 600 MG PO TABS
600.0000 mg | ORAL_TABLET | Freq: Four times a day (QID) | ORAL | Status: DC
  Administered 2017-12-06 – 2017-12-07 (×6): 600 mg via ORAL
  Filled 2017-12-06 (×5): qty 1

## 2017-12-06 MED ORDER — ZOLPIDEM TARTRATE 5 MG PO TABS: 5.0000 mg | ORAL_TABLET | Freq: Every evening | ORAL | Status: DC | PRN

## 2017-12-06 MED ORDER — OXYCODONE-ACETAMINOPHEN 5-325 MG PO TABS: 1.0000 | ORAL_TABLET | ORAL | Status: DC | PRN

## 2017-12-06 MED ORDER — FLUTICASONE PROPIONATE 50 MCG/ACT NA SUSP
1.0000 | Freq: Every day | NASAL | Status: DC
  Filled 2017-12-06: qty 16

## 2017-12-06 MED ORDER — OXYTOCIN 40 UNITS IN LACTATED RINGERS INFUSION - SIMPLE MED
INTRAVENOUS | Status: AC
  Filled 2017-12-06: qty 1000

## 2017-12-06 MED ORDER — METHYLERGONOVINE MALEATE 0.2 MG/ML IJ SOLN
0.2000 mg | Freq: Once | INTRAMUSCULAR | Status: AC
  Administered 2017-12-06: 0.2 mg via INTRAMUSCULAR

## 2017-12-06 MED ORDER — METHYLERGONOVINE MALEATE 0.2 MG PO TABS
0.2000 mg | ORAL_TABLET | ORAL | Status: DC | PRN
  Filled 2017-12-06: qty 1

## 2017-12-06 MED ORDER — SENNOSIDES-DOCUSATE SODIUM 8.6-50 MG PO TABS
2.0000 | ORAL_TABLET | ORAL | Status: DC
  Administered 2017-12-07: 2 via ORAL
  Filled 2017-12-06: qty 2

## 2017-12-06 MED ORDER — FLEET ENEMA 7-19 GM/118ML RE ENEM: 1.0000 | ENEMA | RECTAL | Status: DC | PRN

## 2017-12-06 MED ORDER — SIMETHICONE 80 MG PO CHEW: 80.0000 mg | CHEWABLE_TABLET | ORAL | Status: DC | PRN

## 2017-12-06 MED ORDER — ONDANSETRON HCL 4 MG/2ML IJ SOLN: 4.0000 mg | INTRAMUSCULAR | Status: DC | PRN

## 2017-12-06 MED ORDER — DIPHENHYDRAMINE HCL 25 MG PO CAPS: 25.0000 mg | ORAL_CAPSULE | Freq: Four times a day (QID) | ORAL | Status: DC | PRN

## 2017-12-06 MED ORDER — METHYLERGONOVINE MALEATE 0.2 MG PO TABS
0.2000 mg | ORAL_TABLET | ORAL | Status: DC
  Administered 2017-12-06: 0.2 mg via ORAL
  Filled 2017-12-06 (×3): qty 1

## 2017-12-06 MED ORDER — FENTANYL CITRATE (PF) 100 MCG/2ML IJ SOLN
100.0000 ug | Freq: Once | INTRAMUSCULAR | Status: AC
  Administered 2017-12-06: 100 ug via INTRAVENOUS

## 2017-12-06 MED ORDER — OXYTOCIN 40 UNITS IN LACTATED RINGERS INFUSION - SIMPLE MED: 2.5000 [IU]/h | INTRAVENOUS | Status: DC

## 2017-12-06 MED ORDER — ONDANSETRON HCL 4 MG PO TABS: 4.0000 mg | ORAL_TABLET | ORAL | Status: DC | PRN

## 2017-12-06 MED ORDER — LACTATED RINGERS IV SOLN: 500.0000 mL | INTRAVENOUS | Status: DC | PRN

## 2017-12-06 MED ORDER — COCONUT OIL OIL
1.0000 "application " | TOPICAL_OIL | Status: DC | PRN
  Filled 2017-12-06: qty 120

## 2017-12-06 MED ORDER — LIDOCAINE HCL (PF) 1 % IJ SOLN
30.0000 mL | INTRAMUSCULAR | Status: DC | PRN
  Administered 2017-12-06: 30 mL via SUBCUTANEOUS
  Filled 2017-12-06: qty 30

## 2017-12-06 MED ORDER — LACTATED RINGERS IV SOLN: INTRAVENOUS | Status: DC

## 2017-12-06 MED ORDER — METHYLERGONOVINE MALEATE 0.2 MG PO TABS: 0.2000 mg | ORAL_TABLET | ORAL | Status: DC

## 2017-12-06 MED ORDER — BENZOCAINE-MENTHOL 20-0.5 % EX AERO
1.0000 "application " | INHALATION_SPRAY | CUTANEOUS | Status: DC | PRN
  Administered 2017-12-06 – 2017-12-07 (×3): 1 via TOPICAL
  Filled 2017-12-06 (×4): qty 56

## 2017-12-06 MED ORDER — WITCH HAZEL-GLYCERIN EX PADS: 1.0000 "application " | MEDICATED_PAD | CUTANEOUS | Status: DC | PRN

## 2017-12-06 MED ORDER — ACETAMINOPHEN 325 MG PO TABS
650.0000 mg | ORAL_TABLET | ORAL | Status: DC | PRN
  Administered 2017-12-06 (×3): 650 mg via ORAL

## 2017-12-06 MED ORDER — OXYCODONE-ACETAMINOPHEN 5-325 MG PO TABS: 2.0000 | ORAL_TABLET | ORAL | Status: DC | PRN

## 2017-12-06 MED ORDER — OXYTOCIN BOLUS FROM INFUSION
500.0000 mL | Freq: Once | INTRAVENOUS | Status: AC
  Administered 2017-12-06: 500 mL via INTRAVENOUS

## 2017-12-06 MED ORDER — DIBUCAINE 1 % RE OINT: 1.0000 "application " | TOPICAL_OINTMENT | RECTAL | Status: DC | PRN

## 2017-12-06 MED ORDER — ACETAMINOPHEN 325 MG PO TABS
650.0000 mg | ORAL_TABLET | ORAL | Status: DC | PRN
  Filled 2017-12-06 (×3): qty 2

## 2017-12-06 MED ORDER — LIDOCAINE HCL (PF) 1 % IJ SOLN
INTRAMUSCULAR | Status: AC
  Administered 2017-12-06: 30 mL via SUBCUTANEOUS
  Filled 2017-12-06: qty 30

## 2017-12-06 MED ORDER — SOD CITRATE-CITRIC ACID 500-334 MG/5ML PO SOLN: 30.0000 mL | ORAL | Status: DC | PRN

## 2017-12-06 NOTE — Progress Notes (Signed)
Instructed pt to eat saltines and I would give her Motrin

## 2017-12-06 NOTE — H&P (Signed)
Kerry Potter is a 32 y.o. female G2P1001 38.5 wks presenting in active advanced labor. She was 7 cm in MAU and complete and pushing when brought over to L&D, I arrived right after she came to L&D and started to crown. Uncomplicated pregnancy, 19 wk previa resolved at 28 wks.  Prior term SVD 8'10" baby   OB History    Gravida  2   Para  1   Term  1   Preterm      AB      Living  1     SAB      TAB      Ectopic      Multiple  0   Live Births  1          Past Medical History:  Diagnosis Date  . Acute blood loss anemia 12/10/2015  . Medical history non-contributory    Past Surgical History:  Procedure Laterality Date  . NO PAST SURGERIES    . OVARIAN CYST REMOVAL Left 2004   Family History: family history includes Diabetes in her paternal grandfather; Heart disease in her paternal grandfather; Hypertension in her father, paternal grandfather, and paternal grandmother; Ovarian cancer in her mother. Social History:  reports that she has never smoked. She has never used smokeless tobacco. She reports that she drinks alcohol. She reports that she does not use drugs.     Maternal Diabetes: No Genetic Screening: Normal - Nl Ultrascreen and AFP1  Maternal Ultrasounds/Referrals: Normal Fetal Ultrasounds or other Referrals:  None Maternal Substance Abuse:  No Significant Maternal Medications:  None Significant Maternal Lab Results:  Lab values include: Group B Strep negative Other Comments:  None  ROS neg  History Dilation: 8 Effacement (%): 100 Station: 0 Exam by:: Veronica Mensah RN Blood pressure 140/75, pulse 94, temperature 97.8 F (36.6 C), temperature source Oral, resp. rate 20, last menstrual period 03/10/2017, not currently breastfeeding. Exam Physical Exam  Prenatal labs: ABO, Rh: O (+) Antibody:  neg Rubella:  Imm RPR:   NR HBsAg:   Neg HIV:   Neg GBS:   Neg Glucola normal G/C neg Ultrascreen neg   Assessment/Plan: 32 yo G2P1001, 38.5 wks,  active labor. Note written after delivery.   Kerry Potter 12/06/2017, 6:20 AM

## 2017-12-06 NOTE — Lactation Note (Signed)
This note was copied from a baby's chart. Lactation Consultation Note  Patient Name: Kerry Potter Today's Date: 12/06/2017 Reason for consult: Initial assessment;Early term 37-38.6wks Breastfeeding consultation services and support information given and reviewed.  Baby is 81 hours old and latching to breast easily per mom.  Instructed to feed with feeding cues and feed skin to skin using good waking techniques with breast massage/compression.  Questions answered.  Encouraged to call for assist/concerns prn.  Maternal Data Does the patient have breastfeeding experience prior to this delivery?: Yes  Feeding    LATCH Score                   Interventions    Lactation Tools Discussed/Used     Consult Status Consult Status: Follow-up Date: 12/07/17 Follow-up type: In-patient    Ave Filter 12/06/2017, 2:21 PM

## 2017-12-06 NOTE — Progress Notes (Signed)
Pt placed on bedpan.  Pt had difficulty voiding.  Measured  50 cc.  Pericare given

## 2017-12-07 LAB — CBC
HCT: 30.5 % — ABNORMAL LOW (ref 36.0–46.0)
Hemoglobin: 10.4 g/dL — ABNORMAL LOW (ref 12.0–15.0)
MCH: 29.5 pg (ref 26.0–34.0)
MCHC: 34.1 g/dL (ref 30.0–36.0)
MCV: 86.6 fL (ref 80.0–100.0)
Platelets: 189 10*3/uL (ref 150–400)
RBC: 3.52 MIL/uL — AB (ref 3.87–5.11)
RDW: 13.9 % (ref 11.5–15.5)
WBC: 9.8 10*3/uL (ref 4.0–10.5)

## 2017-12-07 MED ORDER — FERROUS SULFATE 325 (65 FE) MG PO TABS: 325.0000 mg | ORAL_TABLET | Freq: Every day | ORAL | 3 refills | Status: DC

## 2017-12-07 MED ORDER — SENNOSIDES-DOCUSATE SODIUM 8.6-50 MG PO TABS: 2.0000 | ORAL_TABLET | Freq: Every evening | ORAL | 0 refills | Status: DC | PRN

## 2017-12-07 MED ORDER — IBUPROFEN 600 MG PO TABS: 600.0000 mg | ORAL_TABLET | Freq: Four times a day (QID) | ORAL | 0 refills | Status: DC

## 2017-12-07 NOTE — Progress Notes (Signed)
Patient sleeping. Husband asks for nurse to return at 0700 for Motrin and VS

## 2017-12-07 NOTE — Discharge Summary (Signed)
Obstetric Discharge Summary   Patient Name: Kerry Potter DOB: August 25, 1985 MRN: [id number]  Date of Admission: 12/06/2017 Date of Discharge: 12/07/2017 Date of Delivery: 12/06/2017 Gestational Age at Delivery: [redacted]w[redacted]d  Primary OB: [facility] - Dr. Murrell Redden  Antepartum complications: - 19 week previa, resolved at 28 weeks  Prenatal Labs:  ABO, Rh: O (+) Antibody:  neg Rubella:  Imm RPR:   NR HBsAg:   Neg HIV:   Neg GBS:   Neg Glucola normal G/C neg Ultrascreen neg   Admitting Diagnosis: Active labor at 38+[redacted] weeks gestation   Secondary Diagnoses: Patient Active Problem List   Diagnosis Date Noted  . Indication for care in labor or delivery 12/06/2017  . Postpartum care following vaginal delivery (10/7) 12/06/2017  . SVD (spontaneous vaginal delivery) 12/08/2015    Augmentation: None Complications: Precipitous delivery   Date of Delivery: 12/06/17 Delivered By: Dr. Benjie Karvonen Delivery Type: spontaneous vaginal delivery Anesthesia: local Placenta: spontaneous - but delayed Laceration: 2nd degree perineal  Episiotomy: none  Newborn Data: Live born female  Birth Weight: 7 lb 12.5 oz (3530 g) APGAR: 9, 10  Newborn Delivery   Birth date/time:  12/06/2017 05:33:00 Delivery type:  Vaginal, Spontaneous        Hospital/Postpartum Course  (Vaginal Delivery): Pt. Admitted in active labor with precipitous delivery.  Postpartum hemorrhage related to delayed placenta separation and from laceration.  See delivery note for details. Patient had an uncomplicated postpartum course.  By time of discharge on PPD#1, her pain was controlled on oral pain medications; she had appropriate lochia and was ambulating, voiding without difficulty and tolerating regular diet.  She was deemed stable for discharge to home.     Labs: CBC Latest Ref Rng & Units 12/07/2017 12/06/2017 10/13/2016  WBC 4.0 - 10.5 K/uL 9.8 10.5 8.4  Hemoglobin 12.0 - 15.0 g/dL 10.4(L) 13.2 13.1  Hematocrit 36.0 - 46.0 %  30.5(L) 39.6 39.3  Platelets 150 - 400 K/uL 189 193 308.0   O POS  Physical exam:  BP 108/71   Pulse 68   Temp 97.9 F (36.6 C) (Oral)   Resp 16   Ht 5\' 6"  (1.676 m)   Wt 89.8 kg   LMP 03/10/2017   SpO2 99%   Breastfeeding? Unknown   BMI 31.96 kg/m  General: alert and no distress Pulm: normal respiratory effort Lochia: appropriate Abdomen: soft, NT Uterine Fundus: firm, below umbilicus Perineum: healing well, no significant erythema, no significant edema, (+) ecchymosis present - see note for exam  Extremities: No evidence of DVT seen on physical exam. Trace lower extremity edema.   Disposition: stable, discharge to home Baby Feeding: breast milk formula Baby Disposition: home with mom  Contraception: did not discuss  Rh Immune globulin given: N/A Rubella vaccine given: N/A Tdap vaccine given in AP or PP setting: UTD Flu vaccine given in AP or PP setting: UTD   Plan:  Kerry Potter was discharged to home in good condition. [facility]-up appointment at [facility] in 6 weeks.  Discharge Instructions: Per After Visit Summary. Refer to After Visit Summary and [facility] discharge booklet  Activity: Advance as tolerated. Pelvic rest for 6 weeks.   Diet: Regular, Heart Healthy Discharge Medications: Allergies as of 12/07/2017      Reactions   Mobic [meloxicam] Diarrhea      Medication List    TAKE these medications   acetaminophen 500 MG tablet Commonly known as:  TYLENOL Take 2 tablets (1,000 mg total) by mouth every 6 (six) hours  as needed for mild pain (for pain scale < 4).   ferrous sulfate 325 (65 FE) MG tablet Take 1 tablet (325 mg total) by mouth daily with breakfast. Start taking on:  12/08/2017   fluticasone 50 MCG/ACT nasal spray Commonly known as:  FLONASE Place 1 spray into both nostrils daily.   ibuprofen 600 MG tablet Commonly known as:  ADVIL,MOTRIN Take 1 tablet (600 mg total) by mouth every 6 (six) hours.   senna-docusate 8.6-50 MG  tablet Commonly known as:  Senokot-S Take 2 tablets by mouth at bedtime as needed for mild constipation.            Discharge Care Instructions  (From admission, onward)         Start     Ordered   12/07/17 0000  Discharge wound care:    Comments:  Alternate ice packs and sitz baths 3 times daily   12/07/17 1140         Outpatient follow up:  Follow-up Information    Murrell Redden Earlyne Iba, MD. Schedule an appointment as soon as possible for a visit in 6 week(s).   Specialty:  Obstetrics and Gynecology Why:  Postpartum visit Contact information: [address] [address] [phone]           Signed:  Lars Pinks, MSN, CNM [facility]

## 2017-12-07 NOTE — Progress Notes (Signed)
PPD #1, SVD, 2nd degree repair, baby girl "Kerry Potter"  S:  Reports feeling tired and sore; reports baby cluster fed all night; reports they desire early discharge home today              Tolerating po/ No nausea or vomiting / Denies dizziness or SOB             Bleeding is light             Pain controlled with Motrin and Tylenol             Up ad lib / ambulatory / voiding QS  Newborn breast feeding with formula supplementation  O:               VS: BP 108/71   Pulse 68   Temp 97.9 F (36.6 C) (Oral)   Resp 16   Ht 5\' 6"  (1.676 m)   Wt 89.8 kg   LMP 03/10/2017   SpO2 99%   Breastfeeding? Unknown   BMI 31.96 kg/m    LABS:              Recent Labs    12/06/17 0537 12/07/17 0530  WBC 10.5 9.8  HGB 13.2 10.4*  PLT 193 189               Blood type: --/--/O POS (10/07 0537)  Rubella: Immune (03/21 0000)                                Physical Exam:             Alert and oriented X3  Lungs: Clear and unlabored  Heart: regular rate and rhythm / no murmurs  Abdomen: soft, non-tender, non-distended              Fundus: firm, non-tender, U-1  Perineum: well approximated 2nd degree laceration, mild edema; (+) ecchymosis noted over perineum into left buttock; no evidence of hematoma; ice pack in place  Lochia: small, no clots  Extremities: trace pedal edema, no calf pain or tenderness    A/P: PPD # 1, SVD  2nd degree perineal repair    - Ecchymosis noted; reviewed warning s/s   Mild ABL Anemia r/t delayed placental expulsion and perineal tear   - Stable on oral FE  Doing well - stable status  Routine post partum orders  Discharge home today   WOB discharge book given, instructions and warning s/s reviewed   F/u with Dr. Murrell Redden in 6 weeks   Lars Pinks, MSN, [facility]

## 2018-01-17 DIAGNOSIS — Z30430 Encounter for insertion of intrauterine contraceptive device: Secondary | ICD-10-CM | POA: Diagnosis not present

## 2018-01-17 DIAGNOSIS — Z3202 Encounter for pregnancy test, result negative: Secondary | ICD-10-CM | POA: Diagnosis not present

## 2018-02-14 DIAGNOSIS — Z1151 Encounter for screening for human papillomavirus (HPV): Secondary | ICD-10-CM | POA: Diagnosis not present

## 2018-02-14 DIAGNOSIS — Z01419 Encounter for gynecological examination (general) (routine) without abnormal findings: Secondary | ICD-10-CM | POA: Diagnosis not present

## 2018-02-14 DIAGNOSIS — Z30431 Encounter for routine checking of intrauterine contraceptive device: Secondary | ICD-10-CM | POA: Diagnosis not present

## 2018-08-17 ENCOUNTER — Ambulatory Visit (INDEPENDENT_AMBULATORY_CARE_PROVIDER_SITE_OTHER): Payer: 59 | Admitting: Family Medicine

## 2018-08-17 ENCOUNTER — Encounter: Payer: Self-pay | Admitting: Family Medicine

## 2018-08-17 ENCOUNTER — Other Ambulatory Visit: Payer: Self-pay

## 2018-08-17 VITALS — BP 98/68 | HR 92 | Temp 97.4°F | Wt 173.0 lb

## 2018-08-17 DIAGNOSIS — Z0000 Encounter for general adult medical examination without abnormal findings: Secondary | ICD-10-CM

## 2018-08-17 DIAGNOSIS — Z131 Encounter for screening for diabetes mellitus: Secondary | ICD-10-CM

## 2018-08-17 DIAGNOSIS — Z13220 Encounter for screening for lipoid disorders: Secondary | ICD-10-CM

## 2018-08-17 LAB — CBC
HCT: 40.5 % (ref 36.0–46.0)
Hemoglobin: 13.6 g/dL (ref 12.0–15.0)
MCHC: 33.6 g/dL (ref 30.0–36.0)
MCV: 88.6 fl (ref 78.0–100.0)
Platelets: 210 10*3/uL (ref 150.0–400.0)
RBC: 4.57 Mil/uL (ref 3.87–5.11)
RDW: 13 % (ref 11.5–15.5)
WBC: 6.9 10*3/uL (ref 4.0–10.5)

## 2018-08-17 LAB — HEMOGLOBIN A1C: Hgb A1c MFr Bld: 5.3 % (ref 4.6–6.5)

## 2018-08-17 LAB — LIPID PANEL
Cholesterol: 189 mg/dL (ref 0–200)
HDL: 59.3 mg/dL (ref >39.00)
LDL Cholesterol: 114 mg/dL — ABNORMAL HIGH (ref 0–99)
NonHDL: 130.17
Total CHOL/HDL Ratio: 3
Triglycerides: 82 mg/dL (ref 0.0–149.0)
VLDL: 16.4 mg/dL (ref 0.0–40.0)

## 2018-08-17 LAB — BASIC METABOLIC PANEL
BUN: 12 mg/dL (ref 6–23)
CO2: 25 mEq/L (ref 19–32)
Calcium: 8.9 mg/dL (ref 8.4–10.5)
Chloride: 106 mEq/L (ref 96–112)
Creatinine, Ser: 0.64 mg/dL (ref 0.40–1.20)
GFR: 107.92 mL/min (ref >60.00)
Glucose, Bld: 82 mg/dL (ref 70–99)
Potassium: 4 mEq/L (ref 3.5–5.1)
Sodium: 139 mEq/L (ref 135–145)

## 2018-08-17 NOTE — Patient Instructions (Signed)
Preventive Care 18-39 Years, Female Preventive care refers to lifestyle choices and visits with your health care provider that can promote health and wellness. What does preventive care include?   A yearly physical exam. This is also called an annual well check.  Dental exams once or twice a year.  Routine eye exams. Ask your health care provider how often you should have your eyes checked.  Personal lifestyle choices, including: ? Daily care of your teeth and gums. ? Regular physical activity. ? Eating a healthy diet. ? Avoiding tobacco and drug use. ? Limiting alcohol use. ? Practicing safe sex. ? Taking vitamin and mineral supplements as recommended by your health care provider. What happens during an annual well check? The services and screenings done by your health care provider during your annual well check will depend on your age, overall health, lifestyle risk factors, and family history of disease. Counseling Your health care provider may ask you questions about your:  Alcohol use.  Tobacco use.  Drug use.  Emotional well-being.  Home and relationship well-being.  Sexual activity.  Eating habits.  Work and work environment.  Method of birth control.  Menstrual cycle.  Pregnancy history. Screening You may have the following tests or measurements:  Height, weight, and BMI.  Diabetes screening. This is done by checking your blood sugar (glucose) after you have not eaten for a while (fasting).  Blood pressure.  Lipid and cholesterol levels. These may be checked every 5 years starting at age 20.  Skin check.  Hepatitis C blood test.  Hepatitis B blood test.  Sexually transmitted disease (STD) testing.  BRCA-related cancer screening. This may be done if you have a family history of breast, ovarian, tubal, or peritoneal cancers.  Pelvic exam and Pap test. This may be done every 3 years starting at age 21. Starting at age 30, this may be done every 5  years if you have a Pap test in combination with an HPV test. Discuss your test results, treatment options, and if necessary, the need for more tests with your health care provider. Vaccines Your health care provider may recommend certain vaccines, such as:  Influenza vaccine. This is recommended every year.  Tetanus, diphtheria, and acellular pertussis (Tdap, Td) vaccine. You may need a Td booster every 10 years.  Varicella vaccine. You may need this if you have not been vaccinated.  HPV vaccine. If you are 26 or younger, you may need three doses over 6 months.  Measles, mumps, and rubella (MMR) vaccine. You may need at least one dose of MMR. You may also need a second dose.  Pneumococcal 13-valent conjugate (PCV13) vaccine. You may need this if you have certain conditions and were not previously vaccinated.  Pneumococcal polysaccharide (PPSV23) vaccine. You may need one or two doses if you smoke cigarettes or if you have certain conditions.  Meningococcal vaccine. One dose is recommended if you are age 19-21 years and a first-year college student living in a residence hall, or if you have one of several medical conditions. You may also need additional booster doses.  Hepatitis A vaccine. You may need this if you have certain conditions or if you travel or work in places where you may be exposed to hepatitis A.  Hepatitis B vaccine. You may need this if you have certain conditions or if you travel or work in places where you may be exposed to hepatitis B.  Haemophilus influenzae type b (Hib) vaccine. You may need this if you   have certain risk factors. Talk to your health care provider about which screenings and vaccines you need and how often you need them. This information is not intended to replace advice given to you by your health care provider. Make sure you discuss any questions you have with your health care provider. Document Released: 04/14/2001 Document Revised: 09/29/2016  Document Reviewed: 12/18/2014 Elsevier Interactive Patient Education  2019 [employer].

## 2018-08-17 NOTE — Progress Notes (Signed)
Subjective:     Kerry Potter is a 33 y.o. female and is here for a comprehensive physical exam. The patient reports no problems.  Pt followed by OB/Gyn for regular care.  Pt had a baby girl named Autumn  Social History   Socioeconomic History  . Marital status: Married    Spouse name: Not on file  . Number of children: Not on file  . Years of education: Not on file  . Highest education level: Not on file  Occupational History  . Not on file  Social Needs  . Financial resource strain: Not on file  . Food insecurity    Worry: Not on file    Inability: Not on file  . Transportation needs    Medical: Not on file    Non-medical: Not on file  Tobacco Use  . Smoking status: Never Smoker  . Smokeless tobacco: Never Used  Substance and Sexual Activity  . Alcohol use: Yes    Comment: social  . Drug use: No  . Sexual activity: Yes    Birth control/protection: I.U.D.  Lifestyle  . Physical activity    Days per week: Not on file    Minutes per session: Not on file  . Stress: Not on file  Relationships  . Social Herbalist on phone: Not on file    Gets together: Not on file    Attends religious service: Not on file    Active member of club or organization: Not on file    Attends meetings of clubs or organizations: Not on file    Relationship status: Not on file  . Intimate partner violence    Fear of current or ex partner: Not on file    Emotionally abused: Not on file    Physically abused: Not on file    Forced sexual activity: Not on file  Other Topics Concern  . Not on file  Social History Narrative  . Not on file   Health Maintenance  Topic Date Due  . PAP SMEAR-Modifier  03/06/2008  . INFLUENZA VACCINE  10/01/2018  . TETANUS/TDAP  09/08/2023  . HIV Screening  Completed    The following portions of the patient's history were reviewed and updated as appropriate: allergies, current medications, past family history, past medical history, past social history,  past surgical history and problem list.  Review of Systems A comprehensive review of systems was negative.   Objective:    BP 98/68 (BP Location: Left Arm, Patient Position: Sitting, Cuff Size: Normal)   Pulse 92   Temp (!) 97.4 F (36.3 C) (Oral)   Wt 173 lb (78.5 kg)   SpO2 98%   BMI 27.92 kg/m  General appearance: alert, cooperative and no distress Head: Normocephalic, without obvious abnormality, atraumatic Eyes: conjunctivae/corneas clear. PERRL, EOM's intact. Fundi benign. Ears: normal TM's and external ear canals both ears Nose: Nares normal. Septum midline. Mucosa normal. No drainage or sinus tenderness. Throat: lips, mucosa, and tongue normal; teeth and gums normal Neck: no adenopathy, no carotid bruit, no JVD, supple, symmetrical, trachea midline and thyroid not enlarged, symmetric, no tenderness/mass/nodules Lungs: clear to auscultation bilaterally Heart: regular rate and rhythm, S1, S2 normal, no murmur, click, rub or gallop Abdomen: soft, non-tender; bowel sounds normal; no masses,  no organomegaly Extremities: extremities normal, atraumatic, no cyanosis or edema Skin: Skin color, texture, turgor normal. No rashes or lesions Lymph nodes: Cervical, supraclavicular, and axillary nodes normal. Neurologic: Alert and oriented X 3, normal strength  and tone. Normal symmetric reflexes. Normal coordination and gait    Assessment:    Healthy female exam.      Plan:     Anticipatory guidance given including wearing seatbelts, smoke detectors in the home, increasing physical activity, increasing p.o. intake of water and vegetables. -will obtain labs including CBC, lipid panel, BMP, hgb A1C.  Declines STI testing. -pap up to date done last yr, followed by OB/Gyn See After Visit Summary for Counseling Recommendations    F/u prn.  Next CPE in 1 yr.  Grier Mitts, MD

## 2019-01-13 DIAGNOSIS — H5213 Myopia, bilateral: Secondary | ICD-10-CM | POA: Diagnosis not present

## 2019-08-17 ENCOUNTER — Other Ambulatory Visit: Payer: Self-pay

## 2019-08-17 ENCOUNTER — Ambulatory Visit (INDEPENDENT_AMBULATORY_CARE_PROVIDER_SITE_OTHER): Payer: 59 | Admitting: Family Medicine

## 2019-08-17 ENCOUNTER — Encounter: Payer: Self-pay | Admitting: Family Medicine

## 2019-08-17 VITALS — BP 98/78 | HR 74 | Temp 97.8°F | Wt 176.0 lb

## 2019-08-17 DIAGNOSIS — E049 Nontoxic goiter, unspecified: Secondary | ICD-10-CM | POA: Diagnosis not present

## 2019-08-17 DIAGNOSIS — N62 Hypertrophy of breast: Secondary | ICD-10-CM | POA: Insufficient documentation

## 2019-08-17 DIAGNOSIS — Z131 Encounter for screening for diabetes mellitus: Secondary | ICD-10-CM

## 2019-08-17 DIAGNOSIS — Z1379 Encounter for other screening for genetic and chromosomal anomalies: Secondary | ICD-10-CM

## 2019-08-17 DIAGNOSIS — Z0000 Encounter for general adult medical examination without abnormal findings: Secondary | ICD-10-CM | POA: Diagnosis not present

## 2019-08-17 DIAGNOSIS — G8929 Other chronic pain: Secondary | ICD-10-CM | POA: Diagnosis not present

## 2019-08-17 DIAGNOSIS — M546 Pain in thoracic spine: Secondary | ICD-10-CM | POA: Diagnosis not present

## 2019-08-17 DIAGNOSIS — E7841 Elevated Lipoprotein(a): Secondary | ICD-10-CM

## 2019-08-17 LAB — CBC WITH DIFFERENTIAL/PLATELET
Basophils Absolute: 0 10*3/uL (ref 0.0–0.1)
Basophils Relative: 0.8 % (ref 0.0–3.0)
Eosinophils Absolute: 0.2 10*3/uL (ref 0.0–0.7)
Eosinophils Relative: 2.9 % (ref 0.0–5.0)
HCT: 39.8 % (ref 36.0–46.0)
Hemoglobin: 13.4 g/dL (ref 12.0–15.0)
Lymphocytes Relative: 35.5 % (ref 12.0–46.0)
Lymphs Abs: 2.2 10*3/uL (ref 0.7–4.0)
MCHC: 33.7 g/dL (ref 30.0–36.0)
MCV: 88.7 fl (ref 78.0–100.0)
Monocytes Absolute: 0.4 10*3/uL (ref 0.1–1.0)
Monocytes Relative: 6.6 % (ref 3.0–12.0)
Neutro Abs: 3.4 10*3/uL (ref 1.4–7.7)
Neutrophils Relative %: 54.2 % (ref 43.0–77.0)
Platelets: 202 10*3/uL (ref 150.0–400.0)
RBC: 4.48 Mil/uL (ref 3.87–5.11)
RDW: 13 % (ref 11.5–15.5)
WBC: 6.2 10*3/uL (ref 4.0–10.5)

## 2019-08-17 LAB — BASIC METABOLIC PANEL
BUN: 12 mg/dL (ref 6–23)
CO2: 26 mEq/L (ref 19–32)
Calcium: 8.9 mg/dL (ref 8.4–10.5)
Chloride: 107 mEq/L (ref 96–112)
Creatinine, Ser: 0.62 mg/dL (ref 0.40–1.20)
GFR: 111.24 mL/min (ref >60.00)
Glucose, Bld: 81 mg/dL (ref 70–99)
Potassium: 3.9 mEq/L (ref 3.5–5.1)
Sodium: 137 mEq/L (ref 135–145)

## 2019-08-17 LAB — LIPID PANEL
Cholesterol: 147 mg/dL (ref 0–200)
HDL: 42 mg/dL (ref >39.00)
LDL Cholesterol: 88 mg/dL (ref 0–99)
NonHDL: 105.14
Total CHOL/HDL Ratio: 4
Triglycerides: 87 mg/dL (ref 0.0–149.0)
VLDL: 17.4 mg/dL (ref 0.0–40.0)

## 2019-08-17 LAB — TSH: TSH: 1.08 u[IU]/mL (ref 0.35–4.50)

## 2019-08-17 LAB — HEMOGLOBIN A1C: Hgb A1c MFr Bld: 5.2 % (ref 4.6–6.5)

## 2019-08-17 LAB — T4, FREE: Free T4: 0.87 ng/dL (ref 0.60–1.60)

## 2019-08-17 NOTE — Progress Notes (Signed)
Subjective:     Kerry Potter is a 34 y.o. female and is here for a comprehensive physical exam.  Overall patient doing well.  Staying busy with work.  The patient reports problems - breast concern, interest in genetic testing.  Pt states she is done having children.  Since the birth of her kids her breast have increased in size, are heavy, cause back and shoulder pain.  Pt notes indentions in bilateral shoulders at bra straps.  Pt currently wearing a size 38 triple D or larger bra.  Interested in possible reduction as weight loss has not seemed to cause a difference.  Pt also inquires about genetic testing as her mother died in her early 50s of ovarian cancer and was adopted.  Pt knows of heart dz in her paternal grandparents.    Pap done Nov 2019.    Social History   Socioeconomic History  . Marital status: Married    Spouse name: Not on file  . Number of children: Not on file  . Years of education: Not on file  . Highest education level: Not on file  Occupational History  . Not on file  Tobacco Use  . Smoking status: Never Smoker  . Smokeless tobacco: Never Used  Vaping Use  . Vaping Use: Never used  Substance and Sexual Activity  . Alcohol use: Yes    Comment: social  . Drug use: No  . Sexual activity: Yes    Birth control/protection: I.U.D.  Other Topics Concern  . Not on file  Social History Narrative  . Not on file   Social Determinants of Health   Financial Resource Strain:   . Difficulty of Paying Living Expenses:   Food Insecurity:   . Worried About Charity fundraiser in the Last Year:   . Arboriculturist in the Last Year:   Transportation Needs:   . Film/video editor (Medical):   Marland Kitchen Lack of Transportation (Non-Medical):   Physical Activity:   . Days of Exercise per Week:   . Minutes of Exercise per Session:   Stress:   . Feeling of Stress :   Social Connections:   . Frequency of Communication with Friends and Family:   . Frequency of Social Gatherings  with Friends and Family:   . Attends Religious Services:   . Active Member of Clubs or Organizations:   . Attends Archivist Meetings:   Marland Kitchen Marital Status:   Intimate Partner Violence:   . Fear of Current or Ex-Partner:   . Emotionally Abused:   Marland Kitchen Physically Abused:   . Sexually Abused:    Health Maintenance  Topic Date Due  . Hepatitis C Screening  Never done  . COVID-19 Vaccine (1) Never done  . INFLUENZA VACCINE  10/01/2019  . PAP SMEAR-Modifier  01/28/2021  . TETANUS/TDAP  09/08/2023  . HIV Screening  Completed    The following portions of the patient's history were reviewed and updated as appropriate: allergies, current medications, past family history, past medical history, past social history, past surgical history and problem list.  Review of Systems Pertinent items noted in HPI and remainder of comprehensive ROS otherwise negative.   Objective:    BP 98/78 (BP Location: Left Arm, Patient Position: Sitting, Cuff Size: Normal)   Pulse 74   Temp 97.8 F (36.6 C) (Temporal)   Wt 176 lb (79.8 kg)   SpO2 98%   BMI 28.41 kg/m  General appearance: alert, cooperative and no distress  Head: Normocephalic, without obvious abnormality, atraumatic Eyes: conjunctivae/corneas clear. PERRL, EOM's intact. Fundi benign. Ears: normal TM's and external ear canals both ears Nose: Nares normal. Septum midline. Mucosa normal. No drainage or sinus tenderness. Throat: lips, mucosa, and tongue normal; teeth and gums normal Neck: no adenopathy, no carotid bruit, no JVD, supple, symmetrical, trachea midline and thyroid enlarged R lobe>L lobe, no tenderness/mass/nodules Lungs: clear to auscultation bilaterally  Breast: large pendulous.  Conditions in bilateral shoulders from bra strap. Heart: regular rate and rhythm, S1, S2 normal, no murmur, click, rub or gallop Abdomen: soft, non-tender; bowel sounds normal; no masses,  no organomegaly Extremities: extremities normal, atraumatic,  no cyanosis or edema Pulses: 2+ and symmetric Skin: Skin color, texture, turgor normal. No rashes or lesions Lymph nodes: Cervical, supraclavicular, and axillary nodes normal. Neurologic: Alert and oriented X 3, normal strength and tone. Normal symmetric reflexes. Normal coordination and gait    Assessment:    Healthy female exam.     Plan:     Anticipatory guidance given including wearing seatbelts, smoke detectors in the home, increasing physical activity, increasing p.o. intake of water and vegetables. -We will obtain labs -Pap up-to-date.  Done November 2019.  Due November 2023 -We will look into genetic testing for patient -Given handouts -Next CPE in 1 yr. See After Visit Summary for Counseling Recommendations    Chronic bilateral thoracic back pain  -2/2 large breast - Plan: Ambulatory referral to Plastic Surgery  Large breasts  -Given back pain, shoulder pain, and indentions in bilateral shoulders discussed options including breast reduction -Given handout - Plan: Ambulatory referral to Plastic Surgery  Goiter  -Noted on exam.  Right lobe>L lobe -Given handout - Plan: TSH, T4, Free, US THYROID  Genetic testing -Will order after finding appropriate test/counseling for cancer screening  Screening for diabetes mellitus  - Plan: Hemoglobin A1c  Elevated lipoprotein(a)  - Plan: Lipid panel  Follow-up as needed  Grier Mitts, MD

## 2019-08-17 NOTE — Patient Instructions (Signed)
Preventive Care 21-32 Years Old, Female Preventive care refers to visits with your health care provider and lifestyle choices that can promote health and wellness. This includes:  A yearly physical exam. This may also be called an annual well check.  Regular dental visits and eye exams.  Immunizations.  Screening for certain conditions.  Healthy lifestyle choices, such as eating a healthy diet, getting regular exercise, not using drugs or products that contain nicotine and tobacco, and limiting alcohol use. What can I expect for my preventive care visit? Physical exam Your health care provider will check your:  Height and weight. This may be used to calculate body mass index (BMI), which tells if you are at a healthy weight.  Heart rate and blood pressure.  Skin for abnormal spots. Counseling Your health care provider may ask you questions about your:  Alcohol, tobacco, and drug use.  Emotional well-being.  Home and relationship well-being.  Sexual activity.  Eating habits.  Work and work environment.  Method of birth control.  Menstrual cycle.  Pregnancy history. What immunizations do I need?  Influenza (flu) vaccine  This is recommended every year. Tetanus, diphtheria, and pertussis (Tdap) vaccine  You may need a Td booster every 10 years. Varicella (chickenpox) vaccine  You may need this if you have not been vaccinated. Human papillomavirus (HPV) vaccine  If recommended by your health care provider, you may need three doses over 6 months. Measles, mumps, and rubella (MMR) vaccine  You may need at least one dose of MMR. You may also need a second dose. Meningococcal conjugate (MenACWY) vaccine  One dose is recommended if you are age 19-21 years and a first-year college student living in a residence hall, or if you have one of several medical conditions. You may also need additional booster doses. Pneumococcal conjugate (PCV13) vaccine  You may need  this if you have certain conditions and were not previously vaccinated. Pneumococcal polysaccharide (PPSV23) vaccine  You may need one or two doses if you smoke cigarettes or if you have certain conditions. Hepatitis A vaccine  You may need this if you have certain conditions or if you travel or work in places where you may be exposed to hepatitis A. Hepatitis B vaccine  You may need this if you have certain conditions or if you travel or work in places where you may be exposed to hepatitis B. Haemophilus influenzae type b (Hib) vaccine  You may need this if you have certain conditions. You may receive vaccines as individual doses or as more than one vaccine together in one shot (combination vaccines). Talk with your health care provider about the risks and benefits of combination vaccines. What tests do I need?  Blood tests  Lipid and cholesterol levels. These may be checked every 5 years starting at age 20.  Hepatitis C test.  Hepatitis B test. Screening  Diabetes screening. This is done by checking your blood sugar (glucose) after you have not eaten for a while (fasting).  Sexually transmitted disease (STD) testing.  BRCA-related cancer screening. This may be done if you have a family history of breast, ovarian, tubal, or peritoneal cancers.  Pelvic exam and Pap test. This may be done every 3 years starting at age 21. Starting at age 30, this may be done every 5 years if you have a Pap test in combination with an HPV test. Talk with your health care provider about your test results, treatment options, and if necessary, the need for more tests.   Follow these instructions at home: Eating and drinking   Eat a diet that includes fresh fruits and vegetables, whole grains, lean protein, and low-fat dairy.  Take vitamin and mineral supplements as recommended by your health care provider.  Do not drink alcohol if: ? Your health care provider tells you not to drink. ? You are  pregnant, may be pregnant, or are planning to become pregnant.  If you drink alcohol: ? Limit how much you have to 0-1 drink a day. ? Be aware of how much alcohol is in your drink. In the [place] one drink equals one 12 oz bottle of beer (355 mL), one 5 oz glass of wine (148 mL), or one 1 oz glass of hard liquor (44 mL). Lifestyle  Take daily care of your teeth and gums.  Stay active. Exercise for at least 30 minutes on 5 or more days each week.  Do not use any products that contain nicotine or tobacco, such as cigarettes, e-cigarettes, and chewing tobacco. If you need help quitting, ask your health care provider.  If you are sexually active, practice safe sex. Use a condom or other form of birth control (contraception) in order to prevent pregnancy and STIs (sexually transmitted infections). If you plan to become pregnant, see your health care provider for a preconception visit. What's next?  Visit your health care provider once a year for a well check visit.  Ask your health care provider how often you should have your eyes and teeth checked.  Stay up to date on all vaccines. This information is not intended to replace advice given to you by your health care provider. Make sure you discuss any questions you have with your health care provider. Document Revised: 10/28/2017 Document Reviewed: 10/28/2017 Elsevier Patient Education  [place]  A goiter is an enlarged thyroid gland. The thyroid is located in the lower front of the neck. It makes hormones that affect many body parts and systems, including the system that affects how quickly the body burns fuel for energy (metabolism). Most goiters are painless and are not a cause for concern. Some goiters can affect the way your thyroid makes thyroid hormones. Goiters and conditions that cause goiters can be treated, if necessary. What are the causes? Common causes of this condition include:  Lack (deficiency) of a  mineral called iodine. The thyroid gland uses iodine to make thyroid hormones.  Diseases that attack healthy cells in the body (autoimmune diseases) and affect thyroid function, such as Graves' disease or Hashimoto's disease. These diseases may cause the body to produce too much thyroid hormone (hyperthyroidism) or too little of the hormone (hypothyroidism).  Conditions that cause inflammation of the thyroid (thyroiditis).  One or more small growths on the thyroid (nodular goiter). Other causes include:  Medical problems caused by abnormal genes that are passed from parent to child (genetic defects).  Thyroid injury or infection.  Tumors that may or may not be cancerous.  Pregnancy.  Certain medicines.  Exposure to radiation. In some cases, the cause may not be known. What increases the risk? This condition is more likely to develop in:  People who do not get enough iodine in their diet.  People who have a family history of goiter.  Women.  People who are older than age 42.  People who smoke tobacco.  People who have had exposure to radiation. What are the signs or symptoms? The main symptom of this condition is swelling in the lower, front  part of the neck. This swelling can range from a very small bump to a large lump. Other symptoms may include:  A tight feeling in the throat.  A hoarse voice.  Coughing.  Wheezing.  Difficulty swallowing or breathing.  Bulging veins in the neck.  Dizziness. When a goiter is the result of an overactive thyroid (hyperthyroidism), symptoms may also include:  Nervousness or restlessness.  Inability to tolerate heat.  Unexplained weight loss.  Diarrhea.  Change in the texture of hair or skin.  Changes in heartbeat, such as skipped beats, extra beats, or a rapid heart rate.  Loss of menstruation.  Shaky hands.  Increased appetite.  Sleep problems. When a goiter is the result of an underactive thyroid  (hypothyroidism), symptoms may also include:  Feeling like you have no energy (lethargy).  Inability to tolerate cold.  Weight gain that is not explained by a change in diet or exercise habits.  Dry skin.  Coarse hair.  Irregular menstrual periods.  Constipation.  Sadness or depression.  Fatigue. In some cases, there may not be any symptoms and the thyroid hormone levels may be normal. How is this diagnosed? This condition may be diagnosed based on your symptoms, your medical history, and a physical exam. You may have tests, such as:  Blood tests to check thyroid function.  Imaging tests, such as: ? Ultrasound. ? CT scan. ? MRI. ? Thyroid scan.  Removal of a tissue sample (biopsy) of the goiter or any nodules. The sample will be tested to check for cancer. How is this treated? Treatment for this condition depends on the cause and your symptoms. Treatment may include:  Medicines to regulate thyroid hormone levels.  Anti-inflammatory medicines or steroid medicines, if the goiter is caused by inflammation.  Iodine supplements or changes to your diet, if the goiter is caused by iodine deficiency.  Radioactive iodine treatment.  Surgery to remove your thyroid. In some cases, you may only need regular check-ups with your health care provider to monitor your condition, and you may not need treatment. Follow these instructions at home:  Follow instructions from your health care provider about any changes to your diet.  Take over-the-counter and prescription medicines only as told by your health care provider. These include supplements.  Do not use any products that contain nicotine or tobacco, such as cigarettes and e-cigarettes. If you need help quitting, ask your health care provider.  Keep all follow-up visits as told by your health care provider. This is important. Contact a health care provider if:  Your symptoms do not get better with treatment.  You have  nausea, vomiting, or diarrhea. Get help right away if:  You have sudden, unexplained confusion or other mental changes.  You have a fever.  You have chest pain.  You have trouble breathing or swallowing.  You suddenly become very weak.  You experience extreme restlessness.  You feel your heart racing. Summary  A goiter is an enlarged thyroid gland.  The thyroid gland is located in the lower front of the neck. It makes hormones that affect many body parts and systems, including the system that affects how quickly the body burns fuel for energy (metabolism).  The main symptom of this condition is swelling in the lower, front part of the neck. This swelling can range from a very small bump to a large lump.  Treatment for this condition depends on the cause and your symptoms. You may need medicines, supplements, or regular monitoring of your  condition. This information is not intended to replace advice given to you by your health care provider. Make sure you discuss any questions you have with your health care provider. Document Revised: 01/29/2017 Document Reviewed: 11/12/2016 Elsevier Patient Education  [place].  Dyslipidemia Dyslipidemia is an imbalance of waxy, fat-like substances (lipids) in the blood. The body needs lipids in small amounts. Dyslipidemia often involves a high level of cholesterol or triglycerides, which are types of lipids. Common forms of dyslipidemia include:  High levels of LDL cholesterol. LDL is the type of cholesterol that causes fatty deposits (plaques) to build up in the blood vessels that carry blood away from your heart (arteries).  Low levels of HDL cholesterol. HDL cholesterol is the type of cholesterol that protects against heart disease. High levels of HDL remove the LDL buildup from arteries.  High levels of triglycerides. Triglycerides are a fatty substance in the blood that is linked to a buildup of plaques in the arteries. What are  the causes? Primary dyslipidemia is caused by changes (mutations) in genes that are passed down through families (inherited). These mutations cause several types of dyslipidemia. Secondary dyslipidemia is caused by lifestyle choices and diseases that lead to dyslipidemia, such as:  Eating a diet that is high in animal fat.  Not getting enough exercise.  Having diabetes, kidney disease, liver disease, or thyroid disease.  Drinking large amounts of alcohol.  Using certain medicines. What increases the risk? You are more likely to develop this condition if you are an older man or if you are a woman who has gone through menopause. Other risk factors include:  Having a family history of dyslipidemia.  Taking certain medicines, including birth control pills, steroids, some diuretics, and beta-blockers.  Smoking cigarettes.  Eating a high-fat diet.  Having certain medical conditions such as diabetes, polycystic ovary syndrome (PCOS), kidney disease, liver disease, or hypothyroidism.  Not exercising regularly.  Being overweight or obese with too much belly fat. What are the signs or symptoms? In most cases, dyslipidemia does not usually cause any symptoms. In severe cases, very high lipid levels can cause:  Fatty bumps under the skin (xanthomas).  White or gray ring around the black center (pupil) of the eye. Very high triglyceride levels can cause inflammation of the pancreas (pancreatitis). How is this diagnosed? Your health care provider may diagnose dyslipidemia based on a routine blood test (fasting blood test). Because most people do not have symptoms of the condition, this blood testing (lipid profile) is done on adults age 94 and older and is repeated every 5 years. This test checks:  Total cholesterol. This measures the total amount of cholesterol in your blood, including LDL cholesterol, HDL cholesterol, and triglycerides. A healthy number is below 200.  LDL cholesterol.  The target number for LDL cholesterol is different for each person, depending on individual risk factors. Ask your health care provider what your LDL cholesterol should be.  HDL cholesterol. An HDL level of 60 or higher is best because it helps to protect against heart disease. A number below 16 for men or below 82 for women increases the risk for heart disease.  Triglycerides. A healthy triglyceride number is below 150. If your lipid profile is abnormal, your health care provider may do other blood tests. How is this treated? Treatment depends on the type of dyslipidemia that you have and your other risk factors for heart disease and stroke. Your health care provider will have a target range for your  lipid levels based on this information. For many people, this condition may be treated by lifestyle changes, such as diet and exercise. Your health care provider may recommend that you:  Get regular exercise.  Make changes to your diet.  Quit smoking if you smoke. If diet changes and exercise do not help you reach your goals, your health care provider may also prescribe medicine to lower lipids. The most commonly prescribed type of medicine lowers your LDL cholesterol (statin drug). If you have a high triglyceride level, your provider may prescribe another type of drug (fibrate) or an omega-3 fish oil supplement, or both. Follow these instructions at home:  Eating and drinking  Follow instructions from your health care provider or dietitian about eating or drinking restrictions.  Eat a healthy diet as told by your health care provider. This can help you reach and maintain a healthy weight, lower your LDL cholesterol, and raise your HDL cholesterol. This may include: ? Limiting your calories, if you are overweight. ? Eating more fruits, vegetables, whole grains, fish, and lean meats. ? Limiting saturated fat, trans fat, and cholesterol.  If you drink alcohol: ? Limit how much you use. ? Be  aware of how much alcohol is in your drink. In the [place] one drink equals one 12 oz bottle of beer (355 mL), one 5 oz glass of wine (148 mL), or one 1 oz glass of hard liquor (44 mL).  Do not drink alcohol if: ? Your health care provider tells you not to drink. ? You are pregnant, may be pregnant, or are planning to become pregnant. Activity  Get regular exercise. Start an exercise and strength training program as told by your health care provider. Ask your health care provider what activities are safe for you. Your health care provider may recommend: ? 30 minutes of aerobic activity 4-6 days a week. Brisk walking is an example of aerobic activity. ? Strength training 2 days a week. General instructions  Do not use any products that contain nicotine or tobacco, such as cigarettes, e-cigarettes, and chewing tobacco. If you need help quitting, ask your health care provider.  Take over-the-counter and prescription medicines only as told by your health care provider. This includes supplements.  Keep all follow-up visits as told by your health care provider. Contact a health care provider if:  You are: ? Having trouble sticking to your exercise or diet plan. ? Struggling to quit smoking or control your use of alcohol. Summary  Dyslipidemia often involves a high level of cholesterol or triglycerides, which are types of lipids.  Treatment depends on the type of dyslipidemia that you have and your other risk factors for heart disease and stroke.  For many people, treatment starts with lifestyle changes, such as diet and exercise.  Your health care provider may prescribe medicine to lower lipids. This information is not intended to replace advice given to you by your health care provider. Make sure you discuss any questions you have with your health care provider. Document Revised: 10/11/2017 Document Reviewed: 09/17/2017 Elsevier Patient Education  [place].  Breast  Reduction Breast reduction, also called reduction mammoplasty, is surgery to reduce the size of the breasts by removing fat, tissue, and excess skin. The goal of this procedure is to help relieve problems that are caused or made worse by large breasts, such as:  Poor posture.  Long-term (chronic) back and neck pain.  Difficulty exercising.  A rash on the skin under the breasts.  Breast pain.  Grooves in the shoulder from bra straps.  Difficulty with hygiene.  Psychological distress caused by large breasts. Before the procedure, you and your surgeon will decide on a plan for the new size of your breasts. Tell a health care provider about:  Any allergies you have.  All medicines you are taking, including vitamins, herbs, eye drops, creams, and over-the-counter medicines.  Any problems you or family members have had with anesthetic medicines.  Any blood disorders you have.  Any surgeries you have had.  Any medical conditions you have.  Whether you are pregnant or may be pregnant. What are the risks? Generally, this is a safe procedure. However, problems may occur, including:  Infection.  Bleeding. Blood may pool near the incision area (hematoma).  Allergic reactions to medicines.  Damage to nearby structures or organs, such as the dark area around the nipple (areola).  Partial or total numbness in the nipple or breast. Sometimes feeling returns, but not always.  Inability to breastfeed.  Pneumonia.  Blood clots. What happens before the procedure? Staying hydrated Follow instructions from your health care provider about hydration, which may include:  Up to 2 hours before the procedure - you may continue to drink clear liquids, such as water, clear fruit juice, black coffee, and plain tea.  Eating and drinking restrictions Follow instructions from your health care provider about eating and drinking, which may include:  8 hours before the procedure - stop eating  heavy meals or foods, such as meat, fried foods, or fatty foods.  6 hours before the procedure - stop eating light meals or foods, such as toast or cereal.  6 hours before the procedure - stop drinking milk or drinks that contain milk.  2 hours before the procedure - stop drinking clear liquids. Medicines Ask your health care provider about:  Changing or stopping your regular medicines. This is especially important if you are taking diabetes medicines or blood thinners.  Taking medicines such as aspirin and ibuprofen. These medicines can thin your blood. Do not take these medicines unless your health care provider tells you to take them.  Taking other over-the-counter medicines, vitamins, herbs, and supplements. Tests You may have tests or exams, such as:  Blood tests.  An X-ray exam of the breasts (mammogram). General instructions  Ask your health care provider: ? How your surgery site will be marked. ? What steps will be taken to help prevent infection. These may include:  Washing skin with a germ-killing soap.  Taking antibiotic medicine.  You may be asked to bathe using a germ-killing soap before your procedure.  Do not use any products that contain nicotine or tobacco for at least 4 weeks before the procedure. These products include cigarettes, e-cigarettes, and chewing tobacco. If you need help quitting, ask your health care provider.  Plan to have someone take you home from the hospital or clinic.  Plan to have a responsible adult care for you for at least 24 hours after you leave the hospital or clinic. This is important. What happens during the procedure?  An IV will be inserted into one of your veins.  You will be given one or more of the following: ? A medicine to help you relax (sedative). ? A medicine to numb the area (local anesthetic). ? A medicine to make you fall asleep (general anesthetic).  An incision will be made in each breast.  Fat, tissue, and  excess skin will be removed from each breast.  Your breasts will be reshaped. Your nipples may be moved so they are centered on your smaller breasts.  Tubes may be placed in your breast tissue to drain fluid from your surgical area.  Your incisions will be closed with stitches (sutures) and covered with surgical tape. Your breasts will be covered with gauze and elastic bandages (dressings). The procedure may vary among health care providers and hospitals. What happens after the procedure?  Your blood pressure, heart rate, breathing rate, and blood oxygen level will be monitored until you leave the hospital or clinic.  You may continue to receive fluids and medicines through an IV.  You may have to wear compression stockings. These stockings help to prevent blood clots and reduce swelling in your legs.  You may continue to have tubes draining fluid from your surgical area. The tubes will be removed 2-3 days after the surgery.  You may be given a certain type of bra to wear.  You may have breast pain. You will be given medicine to help relieve pain.  Do not drive for 24 hours if you were given a sedative during the procedure. Summary  Breast reduction, also called reduction mammoplasty, is surgery to reduce the size of the breasts by removing fat, tissue, and excess skin.  Tubes may be placed in your breast tissue to drain fluid from your surgical area. The tubes will be removed 2-3 days after the surgery.  Your incisions will be closed with stitches (sutures) and covered with surgical tape. Your breasts will be covered with gauze and elastic bandages (dressings).  You may have breast pain after the procedure. You will be given medicine to help relieve pain.  Plan to have someone take you home from the hospital or clinic. This information is not intended to replace advice given to you by your health care provider. Make sure you discuss any questions you have with your health care  provider. Document Revised: 08/19/2018 Document Reviewed: 08/19/2018 Elsevier Patient Education  [place].

## 2019-08-18 ENCOUNTER — Other Ambulatory Visit: Payer: Self-pay | Admitting: Family Medicine

## 2019-08-30 ENCOUNTER — Ambulatory Visit
Admission: RE | Admit: 2019-08-30 | Discharge: 2019-08-30 | Disposition: A | Discharge status: Home / Self Care | Payer: 59 | Source: Ambulatory Visit | Attending: Family Medicine | Admitting: Family Medicine

## 2019-08-30 DIAGNOSIS — E049 Nontoxic goiter, unspecified: Secondary | ICD-10-CM

## 2019-08-30 DIAGNOSIS — E041 Nontoxic single thyroid nodule: Secondary | ICD-10-CM | POA: Diagnosis not present

## 2019-09-05 DIAGNOSIS — L814 Other melanin hyperpigmentation: Secondary | ICD-10-CM | POA: Diagnosis not present

## 2019-09-05 DIAGNOSIS — L918 Other hypertrophic disorders of the skin: Secondary | ICD-10-CM | POA: Diagnosis not present

## 2019-09-05 DIAGNOSIS — D224 Melanocytic nevi of scalp and neck: Secondary | ICD-10-CM | POA: Diagnosis not present

## 2019-09-05 DIAGNOSIS — D235 Other benign neoplasm of skin of trunk: Secondary | ICD-10-CM | POA: Diagnosis not present

## 2019-09-05 DIAGNOSIS — D225 Melanocytic nevi of trunk: Secondary | ICD-10-CM | POA: Diagnosis not present

## 2019-09-05 DIAGNOSIS — D2230 Melanocytic nevi of unspecified part of face: Secondary | ICD-10-CM | POA: Diagnosis not present

## 2019-09-05 DIAGNOSIS — L578 Other skin changes due to chronic exposure to nonionizing radiation: Secondary | ICD-10-CM | POA: Diagnosis not present

## 2019-11-03 ENCOUNTER — Ambulatory Visit (INDEPENDENT_AMBULATORY_CARE_PROVIDER_SITE_OTHER): Payer: 59 | Admitting: Plastic Surgery

## 2019-11-03 ENCOUNTER — Encounter: Payer: Self-pay | Admitting: Plastic Surgery

## 2019-11-03 ENCOUNTER — Encounter: Payer: Self-pay | Admitting: Family Medicine

## 2019-11-03 ENCOUNTER — Other Ambulatory Visit: Payer: Self-pay

## 2019-11-03 VITALS — BP 127/75 | HR 78 | Temp 98.2°F | Ht 66.0 in | Wt 180.0 lb

## 2019-11-03 DIAGNOSIS — M546 Pain in thoracic spine: Secondary | ICD-10-CM | POA: Diagnosis not present

## 2019-11-03 DIAGNOSIS — M542 Cervicalgia: Secondary | ICD-10-CM | POA: Insufficient documentation

## 2019-11-03 DIAGNOSIS — M549 Dorsalgia, unspecified: Secondary | ICD-10-CM | POA: Insufficient documentation

## 2019-11-03 DIAGNOSIS — G8929 Other chronic pain: Secondary | ICD-10-CM

## 2019-11-03 DIAGNOSIS — N62 Hypertrophy of breast: Secondary | ICD-10-CM

## 2019-11-03 NOTE — Progress Notes (Signed)
Patient ID: Kerry Potter, female    DOB: 1985-08-30, 34 y.o.   MRN: [id number]   Chief Complaint  Patient presents with  . Advice Only  . Breast Problem    Mammary Hyperplasia: The patient is a 34 y.o. female with a history of mammary hyperplasia for several years.  She has extremely large breasts causing symptoms that include the following: Back pain in the upper and lower back, including neck pain. She pulls or pins her bra straps to provide better lift and relief of the pressure and pain. She notices relief by holding her breast up manually.  Her shoulder straps cause grooves and pain and pressure that requires padding for relief. Pain medication is sometimes required with motrin and tylenol.  Activities that are hindered by enlarged breasts include: exercise and running.  She has tried supportive clothing as well as fitted bras without improvement.  Her breasts are extremely large and fairly symmetric.  She has hyperpigmentation of the inframammary area on both sides.  The sternal to nipple distance on the right is 30 cm and the left is 30 cm.  The IMF distance is 17 cm.  She is 5 feet 6 inches tall and weighs 180 pounds.  Preoperative bra size = 56F cup.  She would like to be a C cup.  The estimated excess breast tissue to be removed at the time of surgery = 550 grams on the left and 550 grams on the right.  Mammogram history: None.  Family history of breast cancer: No family history of breast cancer.  Tobacco use: No.  No history of diabetes     Review of Systems  Constitutional: Negative.  Negative for activity change and appetite change.  HENT: Negative.   Eyes: Negative.   Respiratory: Negative.  Negative for chest tightness and shortness of breath.   Cardiovascular: Negative.  Negative for leg swelling.  Gastrointestinal: Negative.  Negative for abdominal distention and abdominal pain.  Endocrine: Negative.   Genitourinary: Negative.   Musculoskeletal: Positive for back pain  and neck pain.  Neurological: Negative.   Hematological: Negative.   Psychiatric/Behavioral: Negative.     Past Medical History:  Diagnosis Date  . Acute blood loss anemia 12/10/2015  . Medical history non-contributory     Past Surgical History:  Procedure Laterality Date  . NO PAST SURGERIES    . OVARIAN CYST REMOVAL Left 2004      Current Outpatient Medications:  .  acetaminophen (TYLENOL) 500 MG tablet, Take 2 tablets (1,000 mg total) by mouth every 6 (six) hours as needed for mild pain (for pain scale < 4)., Disp: 30 tablet, Rfl: 0 .  fluticasone (FLONASE) 50 MCG/ACT nasal spray, Place 1 spray into both nostrils daily., Disp: , Rfl:  .  ibuprofen (ADVIL,MOTRIN) 600 MG tablet, Take 1 tablet (600 mg total) by mouth every 6 (six) hours., Disp: 30 tablet, Rfl: 0 .  levonorgestrel (MIRENA) 20 MCG/24HR IUD, 1 each by Intrauterine route once., Disp: , Rfl:    Objective:   Vitals:   11/03/19 1012  BP: 127/75  Pulse: 78  Temp: 98.2 F (36.8 C)  SpO2: 99%    Physical Exam Vitals and nursing note reviewed.  Constitutional:      Appearance: Normal appearance.  HENT:     Head: Normocephalic and atraumatic.  Cardiovascular:     Rate and Rhythm: Normal rate.     Pulses: Normal pulses.  Pulmonary:     Effort: Pulmonary effort is normal.  No respiratory distress.  Abdominal:     General: Abdomen is flat. There is no distension.     Tenderness: There is no abdominal tenderness.  Skin:    General: Skin is warm.     Capillary Refill: Capillary refill takes less than 2 seconds.  Neurological:     General: No focal deficit present.     Mental Status: She is alert and oriented to person, place, and time.  Psychiatric:        Mood and Affect: Mood normal.        Behavior: Behavior normal.        Thought Content: Thought content normal.     Assessment & Plan:  Symptomatic mammary hypertrophy  Chronic bilateral thoracic back pain  Neck pain  The patient is a very good  candidate for a breast reduction with liposuction.  She has not had physical therapy yet and is willing to try it.  I think that that would be worthwhile.  She does not have a family history of breast cancer but a baseline would be helpful since she is 34 years old.  She is certainly willing to get a mammogram.  I will go ahead and put this order in.  She is wanting to do the surgery in January so I will wait to submit it.  She knows to give [place] a call when she is ready for the submission to insurance for preauthorization. Pictures were obtained of the patient and placed in the chart with the patient's or guardian's permission.   [place], DO

## 2019-11-09 ENCOUNTER — Other Ambulatory Visit: Payer: Self-pay | Admitting: Plastic Surgery

## 2019-11-09 DIAGNOSIS — Z1231 Encounter for screening mammogram for malignant neoplasm of breast: Secondary | ICD-10-CM

## 2019-11-21 ENCOUNTER — Ambulatory Visit: Payer: 59

## 2019-11-22 ENCOUNTER — Encounter: Payer: Self-pay | Admitting: Physical Therapy

## 2019-11-22 ENCOUNTER — Ambulatory Visit: Payer: 59 | Attending: Plastic Surgery | Admitting: Physical Therapy

## 2019-11-22 ENCOUNTER — Other Ambulatory Visit: Payer: Self-pay

## 2019-11-22 DIAGNOSIS — G8929 Other chronic pain: Secondary | ICD-10-CM | POA: Diagnosis not present

## 2019-11-22 DIAGNOSIS — M545 Low back pain: Secondary | ICD-10-CM | POA: Diagnosis not present

## 2019-11-22 DIAGNOSIS — M542 Cervicalgia: Secondary | ICD-10-CM | POA: Insufficient documentation

## 2019-11-22 DIAGNOSIS — M6281 Muscle weakness (generalized): Secondary | ICD-10-CM | POA: Insufficient documentation

## 2019-11-22 DIAGNOSIS — R293 Abnormal posture: Secondary | ICD-10-CM | POA: Diagnosis not present

## 2019-11-22 DIAGNOSIS — M546 Pain in thoracic spine: Secondary | ICD-10-CM | POA: Insufficient documentation

## 2019-11-22 NOTE — Therapy (Signed)
[place], [place], [address] Phone: [phone]   Fax:  [phone]  Physical Therapy Evaluation  Patient Details  Name: Kerry Potter MRN: [id number] Date of Birth: 03/16/1985 Referring Provider (PT): Wallace Going, DO   Encounter Date: 11/22/2019   PT End of Session - 11/22/19 0921    Visit Number 1    Number of Visits 6    Date for PT Re-Evaluation 01/17/20    Authorization Type [facility]    PT Start Time 0832    PT Stop Time 0915    PT Time Calculation (min) 43 min    Activity Tolerance Patient tolerated treatment well    Behavior During Therapy [facility] for tasks assessed/performed           Past Medical History:  Diagnosis Date  . Acute blood loss anemia 12/10/2015  . Medical history non-contributory     Past Surgical History:  Procedure Laterality Date  . NO PAST SURGERIES    . OVARIAN CYST REMOVAL Left 2004    There were no vitals filed for this visit.    Subjective Assessment - 11/22/19 0834    Subjective Patient reports lower, upper back and neck discomfort since her first child in 2017. She states she has difficulty playing with her kids due to pain and fatigue/weakness from all the bending over. Patient also notes she feels like she is 34 years old when she gets out of bed and had difficulty standing from a seated or lying position. Patient sits majority at time at work and states she has to get up and move due to discomfort.    Pertinent History Mammary hypertrophy    Limitations Sitting;Standing;House hold activities;Lifting    How long can you sit comfortably? "Can sit for a few house then needs to stand and move around"    How long can you stand comfortably? "Only when goes from sitting to standing"    How long can you walk comfortably? No limitation    Diagnostic tests None performed    Patient Stated Goals Get stronger to improve posture and reduce pain    Currently in Pain?  Yes    Pain Score 3     Pain Location Back    Pain Orientation Upper;Mid;Lower    Pain Descriptors / Indicators --   "discomfort and weakness"   Pain Type Chronic pain    Pain Radiating Towards upper trap region    Pain Onset More than a month ago    Pain Frequency Constant    Aggravating Factors  Sitting, bending, exercise    Pain Relieving Factors Rest    Effect of Pain on Daily Activities Patient feels limited playing with her kids and ability to exercise              [facility] PT Assessment - 11/22/19 0001      Assessment   Medical Diagnosis Symptomatic mammary hypertrophy    Referring Provider (PT) Dilling[place], Loel Lofty, DO    Onset Date/Surgical Date --   approximately 2017   Hand Dominance Right    Next MD Visit Will schedule follow-up after PT    Prior Therapy No      Precautions   Precautions None      Restrictions   Weight Bearing Restrictions No      Balance Screen   Has the patient fallen in the past 6 months No    Has the patient had a decrease in  activity level because of a fear of falling?  No    Is the patient reluctant to leave their home because of a fear of falling?  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    Leisure Lifting at gym      Cognition   Overall Cognitive Status Within [place] for tasks assessed      Observation/Other Assessments   Observations Patient apears in no apparent distress    Focus on Therapeutic Outcomes (FOTO)  NA - inappropriate diagnosis      Sensation   Light Touch Appears Intact      Coordination   Gross Motor Movements are Fluid and Coordinated Yes      Posture/Postural Control   Posture Comments Patient exhibits rounded shoulder and mild forward head posture,  worse with sitting, able to correct when cued      ROM / Strength   AROM / PROM / Strength AROM;Strength      AROM   Overall AROM Comments Shoulder, cervical, and lumbar AROM grossly WFL and non-painful, slight upper trap tightness noted    AROM Assessment Site Thoracic    Thoracic Extension Limited    Thoracic - Right Rotation Limited    Thoracic - Left Rotation Limited      Strength   Overall Strength Comments Periscapular and core strength grossly 4-/5 MMT    Strength Assessment Site Shoulder;Hip    Right/Left Shoulder Right;Left    Right Shoulder Flexion 5/5    Right Shoulder Extension 4/5    Right Shoulder ABduction 5/5    Right Shoulder Internal Rotation 5/5    Right Shoulder External Rotation 5/5    Right Shoulder Horizontal ABduction 4/5    Left Shoulder Flexion 5/5    Left Shoulder Extension 4/5    Left Shoulder ABduction 5/5    Left Shoulder Internal Rotation 5/5    Left Shoulder External Rotation 5/5    Left Shoulder Horizontal ABduction 4/5    Right/Left Hip Right;Left    Right Hip Flexion 4+/5    Right Hip Extension 4-/5    Right Hip ABduction 4-/5    Left Hip Flexion 4+/5    Left Hip Extension 4-/5    Left Hip ABduction 4-/5      Flexibility   Soft Tissue Assessment /Muscle Length yes    Hamstrings WFL    Quadriceps WFL      Palpation   Palpation comment Non-TTP, increased tension bilateral upper trap region      Special Tests   Other special tests None performed, no radicular symptoms      Transfers   Transfers Independent with all Transfers                      Objective measurements completed on examination: See above findings.       [facility] Adult PT Treatment/Exercise - 11/22/19 0001      Exercises   Exercises Neck;Lumbar      Neck Exercises: Theraband   Rows 15 reps;Green      Neck Exercises: Prone   Other Prone Exercise Prone I, T, Y, W on physioball with knees down      Lumbar Exercises: Prone  Opposite Arm/Leg Raise  10 reps    Opposite Arm/Leg Raise Limitations dead bug with knees remaining bent, demonstrated with and without physioball      Neck Exercises: Stretches   Other Neck Stretches Kneeling and supine thoracic extension mobs    Other Neck Stretches Sidelying thoracic rotation mob                  PT Education - 11/22/19 0921    Education Details Exam findings, HEP, POC    Person(s) Educated Patient    Methods Explanation;Demonstration;Tactile cues;Verbal cues;Handout    Comprehension Verbalized understanding;Returned demonstration;Verbal cues required;Tactile cues required;Need further instruction            PT Short Term Goals - 11/22/19 0922      PT SHORT TERM GOAL #1   Title STG = LTG             PT Long Term Goals - 11/22/19 0923      PT LONG TERM GOAL #1   Title Patient will be I with initial HEP to reduce pain and progress postural control    Time 8    Period Weeks    Status New    Target Date 01/17/20      PT LONG TERM GOAL #2   Title Patient will demonstrate appropriate seated posture and will have no limitation or pain with sitting at work    Time 8    Period Weeks    Status New    Target Date 01/17/20      PT LONG TERM GOAL #3   Title Patient will exhibit >/= 4+/5 MMT periscapular strength in order to improve ability to lean forward and play with children without limitation    Time 8    Period Weeks    Status New    Target Date 01/17/20      PT LONG TERM GOAL #4   Title Patient will demonstrate improved core and hip strength of grossly >/= 4+/5 MMT to reduce pain with lifting and exercise    Time 8    Period Weeks    Status New    Target Date 01/17/20                  Plan - 11/22/19 4315    Clinical Impression Statement Patient presents to PT with report of neck, upper and lower back pain secondary to mammary hypertrophy and feeling of weakness/fatigue with postural control. Currently she exhibits good cervical, shoulder, and lumbar  motion with limitation of thoracic extension and rotation, impaired strength of periscapular and core/hip musculature, postural and endurance deficits leading to difficulty sitting through work and playing with her kids. Patient was provided with initial exercise program in order to promote postural and core control, and she would benefit from continued skilled PT to progress her strength, endurance, and mobility in order to improve postural and reduce pain with activity.    Personal Factors and Comorbidities Time since onset of injury/illness/exacerbation;Past/Current Experience    Examination-Activity Limitations Sit;Lift;Bend;Caring for Others    Examination-Participation Restrictions Occupation;Cleaning;Laundry    Stability/Clinical Decision Making Stable/Uncomplicated    Clinical Decision Making Low    Rehab Potential Good    PT Frequency 1x / week    PT Duration 8 weeks   6 visits   PT Treatment/Interventions ADLs/Self Care Home Management;Cryotherapy;Electrical Stimulation;Moist Heat;Therapeutic exercise;Therapeutic activities;Patient/family education;Functional mobility training;Manual techniques;Dry needling;Passive range of motion;Taping;Spinal Manipulations;Joint Manipulations    PT Next Visit Plan Review HEP and  progress PRN, core and hip strengthening, progress periscapular strengthening and postural control, thoracic mobility as needed, manual/dry needling as needed for cervical region    PT Home Exercise Plan [email or web address]: prone on physioball I, T, Y, W; row with green; kneeling thoracic extension mob, supine thoracic extension mob over FR, sidelying thoracic rotation mob; dead bug    Consulted and Agree with Plan of Care Patient           Patient will benefit from skilled therapeutic intervention in order to improve the following deficits and impairments:  Decreased range of motion, Pain, Decreased activity tolerance, Postural dysfunction, Decreased strength  Visit  Diagnosis: Cervicalgia  Pain in thoracic spine  Chronic bilateral low back pain without sciatica  Muscle weakness (generalized)  Abnormal posture     Problem List Patient Active Problem List   Diagnosis Date Noted  . Back pain 11/03/2019  . Neck pain 11/03/2019  . 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 Goiter 08/17/2019  . Symptomatic mammary hypertrophy 08/17/2019  . Indication for care in labor or delivery 12/06/2017  . Postpartum care following vaginal delivery (10/7) 12/06/2017  . SVD (spontaneous vaginal delivery) 12/08/2015    Hilda Blades, PT, DPT, LAT, ATC 11/22/19  9:50 AM Phone: [phone] Fax: [facility] [address] [place], [place], [address] Phone: [phone]   Fax:  [phone]  Name: Marcey Persad MRN: [id number] Date of Birth: April 24, 1985

## 2019-11-22 NOTE — Patient Instructions (Signed)
Access Code: [email or web address] URL: [email or web address] Date: 11/22/2019 Prepared by: Hilda Blades  Exercises Prone Shoulder Extension on Swiss Ball - 1 x daily - 4-5 x weekly - 2 sets - 10-15 reps - 2-3 seconds hold Prone Middle Trapezius Strengthening on Swiss Ball - 1 x daily - 4-5 x weekly - 2 sets - 10 reps - 2-3 seconds hold Prone Lower Trapezius Strengthening on Swiss Ball with Dumbbells - 1 x daily - 4-5 x weekly - 2 sets - 10 reps - 2-3 seconds hold Prone Shoulder W on [employer] - 1 x daily - 4-5 x weekly - 2 sets - 10 reps - 2-3 seconds hold Standing Bilateral Low Shoulder Row with Anchored Resistance - 1 x daily - 4-5 x weekly - 2 sets - 15 reps Sidelying Thoracic and Shoulder Rotation - 2 x daily - 7 x weekly - 10 reps - 2-3 seconds hold Prone Chest Stretch on Chair - 2 x daily - 7 x weekly - 10 reps - 2-3 seconds hold Thoracic Extension Mobilization on Foam Roll - 2 x daily - 7 x weekly - 10 reps - 2-3 seconds hold Dead Bug - 1 x daily - 4-5 x weekly - 3 sets - 10 reps

## 2019-11-23 ENCOUNTER — Ambulatory Visit
Admission: RE | Admit: 2019-11-23 | Discharge: 2019-11-23 | Disposition: A | Discharge status: Home / Self Care | Payer: 59 | Source: Ambulatory Visit | Attending: Plastic Surgery | Admitting: Plastic Surgery

## 2019-11-23 DIAGNOSIS — Z1231 Encounter for screening mammogram for malignant neoplasm of breast: Secondary | ICD-10-CM | POA: Diagnosis not present

## 2019-11-28 ENCOUNTER — Other Ambulatory Visit: Payer: Self-pay

## 2019-11-28 ENCOUNTER — Ambulatory Visit: Payer: 59

## 2019-11-28 DIAGNOSIS — G8929 Other chronic pain: Secondary | ICD-10-CM

## 2019-11-28 DIAGNOSIS — R293 Abnormal posture: Secondary | ICD-10-CM

## 2019-11-28 DIAGNOSIS — M542 Cervicalgia: Secondary | ICD-10-CM

## 2019-11-28 DIAGNOSIS — M6281 Muscle weakness (generalized): Secondary | ICD-10-CM

## 2019-11-28 DIAGNOSIS — M546 Pain in thoracic spine: Secondary | ICD-10-CM

## 2019-11-28 NOTE — Therapy (Signed)
[place], [place], [address] Phone: [phone]   Fax:  [phone]  Physical Therapy Treatment  Patient Details  Name: Kerry Potter MRN: [id number] Date of Birth: 1985-08-11 Referring Provider (PT): Wallace Going, DO   Encounter Date: 11/28/2019   PT End of Session - 11/28/19 1201    Visit Number 2    Number of Visits 6    Date for PT Re-Evaluation 01/17/20    Authorization Type [facility]    PT Start Time 5009    PT Stop Time 1133    PT Time Calculation (min) 44 min    Activity Tolerance Patient tolerated treatment well    Behavior During Therapy [facility] for tasks assessed/performed           Past Medical History:  Diagnosis Date  . Acute blood loss anemia 12/10/2015  . Medical history non-contributory     Past Surgical History:  Procedure Laterality Date  . NO PAST SURGERIES    . OVARIAN CYST REMOVAL Left 2004    There were no vitals filed for this visit.   Subjective Assessment - 11/28/19 1202    Subjective Pt reports she has been consistent with completing HEP. Pt reports lower neck/mid back pain today of 4/10.    Pertinent History Mammary hypertrophy    Limitations Sitting;Standing;House hold activities;Lifting    How long can you sit comfortably? "Can sit for a few house then needs to stand and move around"    How long can you stand comfortably? "Only when goes from sitting to standing"    Patient Stated Goals Get stronger to improve posture and reduce pain    Currently in Pain? Yes    Pain Score 4     Pain Location Back   lower neck   Pain Orientation Mid    Pain Descriptors / Indicators Aching    Pain Radiating Towards upper trap region    Pain Onset More than a month ago    Pain Frequency Constant    Aggravating Factors  Sitting, bending, exercise    Pain Relieving Factors Rest    Effect of Pain on Daily Activities Patient feels limited playing with her kids and ability to  exercise                             [facility] Adult PT Treatment/Exercise - 11/28/19 0001      Exercises   Exercises Neck;Lumbar      Neck Exercises: Standing   Neck Retraction 10 reps;3 secs    Other Standing Exercises Romboid stretch 2x; 20 sec    Other Standing Exercises Scapular retraction 10x; 3 sec      Neck Exercises: Prone   Other Prone Exercise Prone I, T, Y, W on physioball with knees down; 10 reps each       Lumbar Exercises: Stretches   Quadruped Mid Back Stretch 3 reps;20 seconds    Quadruped Mid Back Stretch Limitations reach through to open book      Lumbar Exercises: Supine   Dead Bug 10 reps    Dead Bug Limitations 2 sets; arms/straigthening legs c physioball      Lumbar Exercises: Quadruped   Madcat/Old Horse 5 reps    Madcat/Old Horse Limitations to child's pose forward and lateral      Neck Exercises: Stretches   Upper Trapezius Stretch Right;Left;2 reps;20 seconds    Levator Stretch Right;Left;2 reps;20  seconds    Chest Stretch 1 rep;20 seconds    Chest Stretch Limitations 120, 90 60d                  PT Education - 11/28/19 1200    Education Details HEP; 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) Educated Patient    Methods Explanation;Demonstration;Tactile cues;Verbal cues;Handout    Comprehension Verbalized understanding;Returned demonstration;Verbal cues required;Tactile cues required;Need further instruction            PT Short Term Goals - 11/22/19 0922      PT SHORT TERM GOAL #1   Title STG = LTG             PT Long Term Goals - 11/22/19 0923      PT LONG TERM GOAL #1   Title Patient will be I with initial HEP to reduce pain and progress postural control    Time 8    Period Weeks    Status New    Target Date 01/17/20      PT LONG TERM GOAL #2   Title Patient will demonstrate appropriate seated posture and will have no limitation or pain with sitting at work    Time 8    Period Weeks     Status New    Target Date 01/17/20      PT LONG TERM GOAL #3   Title Patient will exhibit >/= 4+/5 MMT periscapular strength in order to improve ability to lean forward and play with children without limitation    Time 8    Period Weeks    Status New    Target Date 01/17/20      PT LONG TERM GOAL #4   Title Patient will demonstrate improved core and hip strength of grossly >/= 4+/5 MMT to reduce pain with lifting and exercise    Time 8    Period Weeks    Status New    Target Date 01/17/20                 Plan - 11/28/19 1212    Clinical Impression Statement Provided education on proper work station set up and the use of cervical and scapular retraction exs to combat daily and gravitation forces which can cause pain. Addiitonal stretching exs were provided and back and abdominal strengthening exs were reviewed and completed. Pt returned demonstration of exs and is to assess the benefit of the new exs provided.    Personal Factors and Comorbidities Time since onset of injury/illness/exacerbation;Past/Current Experience    Examination-Activity Limitations Sit;Lift;Bend;Caring for Others    Examination-Participation Restrictions Occupation;Cleaning;Laundry    Stability/Clinical Decision Making Stable/Uncomplicated    Clinical Decision Making Low    Rehab Potential Good    PT Frequency 1x / week    PT Duration 8 weeks    PT Treatment/Interventions ADLs/Self Care Home Management;Cryotherapy;Electrical Stimulation;Moist Heat;Therapeutic exercise;Therapeutic activities;Patient/family education;Functional mobility training;Manual techniques;Dry needling;Passive range of motion;Taping;Spinal Manipulations;Joint Manipulations    PT Next Visit Plan Asess HEP. Progress strengthening exs as indicated    PT Home Exercise Plan [email or web address]: prone on physioball I, T, Y, W; row with green; kneeling thoracic extension mob, supine thoracic extension mob over FR, sidelying thoracic rotation mob;  dead bug; cervical and meid back stretching exs added    Consulted and Agree with Plan of Care Patient           Patient will benefit from skilled therapeutic intervention in order to improve  the following deficits and impairments:  Decreased range of motion, Pain, Decreased activity tolerance, Postural dysfunction, Decreased strength  Visit Diagnosis: Cervicalgia  Pain in thoracic spine  Chronic bilateral low back pain without sciatica  Muscle weakness (generalized)  Abnormal posture     Problem List Patient Active Problem List   Diagnosis Date Noted  . Back pain 11/03/2019  . Neck pain 11/03/2019  . 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 Goiter 08/17/2019  . Symptomatic mammary hypertrophy 08/17/2019  . Indication for care in labor or delivery 12/06/2017  . Postpartum care following vaginal delivery (10/7) 12/06/2017  . SVD (spontaneous vaginal delivery) 12/08/2015    Gar Ponto MS, PT 11/28/19 1:10 PM  [facility] [address] [place], [place], [address] Phone: [phone]   Fax:  [phone]  Name: Jenalyn Girdner MRN: [id number] Date of Birth: 1985/12/20

## 2019-12-08 ENCOUNTER — Ambulatory Visit: Payer: 59 | Admitting: Physical Therapy

## 2019-12-13 ENCOUNTER — Encounter: Payer: Self-pay | Admitting: Physical Therapy

## 2019-12-13 ENCOUNTER — Other Ambulatory Visit: Payer: Self-pay

## 2019-12-13 ENCOUNTER — Ambulatory Visit: Payer: 59 | Attending: Plastic Surgery | Admitting: Physical Therapy

## 2019-12-13 DIAGNOSIS — M6281 Muscle weakness (generalized): Secondary | ICD-10-CM | POA: Diagnosis not present

## 2019-12-13 DIAGNOSIS — M546 Pain in thoracic spine: Secondary | ICD-10-CM | POA: Diagnosis not present

## 2019-12-13 DIAGNOSIS — M542 Cervicalgia: Secondary | ICD-10-CM | POA: Insufficient documentation

## 2019-12-13 DIAGNOSIS — M545 Low back pain, unspecified: Secondary | ICD-10-CM | POA: Diagnosis not present

## 2019-12-13 DIAGNOSIS — G8929 Other chronic pain: Secondary | ICD-10-CM | POA: Insufficient documentation

## 2019-12-13 DIAGNOSIS — R293 Abnormal posture: Secondary | ICD-10-CM | POA: Diagnosis not present

## 2019-12-13 NOTE — Patient Instructions (Signed)
Access Code: [email or web address] URL: [email or web address] Date: 12/13/2019 Prepared by: Hilda Blades  Exercises Prone Shoulder Extension on Swiss Ball - 1 x daily - 4-5 x weekly - 2 sets - 10-15 reps - 2-3 seconds hold Prone Middle Trapezius Strengthening on Swiss Ball - 1 x daily - 4-5 x weekly - 2 sets - 10 reps - 2-3 seconds hold Prone Lower Trapezius Strengthening on Swiss Ball with Dumbbells - 1 x daily - 4-5 x weekly - 2 sets - 10 reps - 2-3 seconds hold Prone Shoulder W on [employer] - 1 x daily - 4-5 x weekly - 2 sets - 10 reps - 2-3 seconds hold Standing Bilateral Low Shoulder Row with Anchored Resistance - 1 x daily - 4-5 x weekly - 2 sets - 15 reps Sidelying Thoracic and Shoulder Rotation - 2 x daily - 7 x weekly - 10 reps - 2-3 seconds hold Prone Chest Stretch on Chair - 2 x daily - 7 x weekly - 10 reps - 2-3 seconds hold Thoracic Extension Mobilization on Foam Roll - 2 x daily - 7 x weekly - 10 reps - 2-3 seconds hold Dead Bug - 1 x daily - 4-5 x weekly - 3 sets - 10 reps Seated Scapular Retraction - 6 x daily - 7 x weekly - 1 sets - 5-10 reps - 3 hold Seated Cervical Retraction - 6 x daily - 7 x weekly - 1 sets - 5-10 reps - 3 hold Cat-Camel to Child's Pose - 1 x daily - 7 x weekly - 1 sets - 3-5 reps - 20 hold Seated Gentle Upper Trapezius Stretch - 1 x daily - 7 x weekly - 1 sets - 3 reps - 20 hold Gentle Levator Scapulae Stretch - 1 x daily - 7 x weekly - 1 sets - 3 reps - 20 hold Quadruped Full Range Thoracic Rotation with Reach - 1 x daily - 7 x weekly - 1 sets - 5 reps - 20 hold Doorway Pec Stretch at 90 Degrees Abduction - 1 x daily - 7 x weekly - 1 sets - 3 reps - 20 hold Doorway Rhomboid Stretch - 1 x daily - 7 x weekly - 1 sets - 3 reps - 20 hold

## 2019-12-13 NOTE — Therapy (Signed)
[place], [place], [address] Phone: [phone]   Fax:  [phone]  Physical Therapy Treatment  Patient Details  Name: Kerry Potter MRN: [id number] Date of Birth: 1986/02/14 Referring Provider (PT): Marla Roe Loel Lofty, DO   Encounter Date: 12/13/2019   PT End of Session - 12/13/19 0818    Visit Number 3    Number of Visits 6    Date for PT Re-Evaluation 01/17/20    Authorization Type [facility]    PT Start Time 0825    PT Stop Time 0905    PT Time Calculation (min) 40 min    Activity Tolerance Patient tolerated treatment well    Behavior During Therapy [facility] for tasks assessed/performed           Past Medical History:  Diagnosis Date   Acute blood loss anemia 12/10/2015   Medical history non-contributory     Past Surgical History:  Procedure Laterality Date   NO PAST SURGERIES     OVARIAN CYST REMOVAL Left 2004    There were no vitals filed for this visit.   Subjective Assessment - 12/13/19 0824    Subjective Patient reports she is doing well, she has been busy.    Patient Stated Goals Get stronger to improve posture and reduce pain    Currently in Pain? Yes    Pain Score 3     Pain Location Back    Pain Orientation Mid;Upper;Lower    Pain Descriptors / Indicators Aching    Pain Type Chronic pain    Pain Onset More than a month ago    Pain Frequency Constant    Aggravating Factors  Sitting, bending, exercise    Pain Relieving Factors Rest              [facility] PT Assessment - 12/13/19 0001      Posture/Postural Control   Posture Comments Patient exhibits rounded shoulder and mild forward head posture, worse with sitting, able to correct when cued      AROM   Thoracic Extension Limited      Strength   Overall Strength Comments Periscapular and core strength grossly 4-/5 MMT                         [facility] Adult PT Treatment/Exercise - 12/13/19 0001       Exercises   Exercises Neck;Lumbar      Neck Exercises: Machines for Strengthening   UBE (Upper Arm Bike) L2 x 4 min (2 fwd/bwd)      Neck Exercises: Theraband   Rows 15 reps;Blue   2 sets   Horizontal ABduction 15 reps   2 sets   Horizontal ABduction Limitations yellow    Other Theraband Exercises Diagonals with yellow 2 x 10      Lumbar Exercises: Supine   Dead Bug 10 reps   2 sets   Dead Bug Limitations physioball    Bridge 10 reps;3 seconds   2 sets     Lumbar Exercises: Quadruped   Madcat/Old Horse 15 reps    Opposite Arm/Leg Raise 10 reps;3 seconds   2 sets   Other Quadruped Lumbar Exercises Child's pose with FR for thoracic extension 5 x 5 sec    Other Quadruped Lumbar Exercises Thoracic rotation HBH 2 x 10 each      Neck Exercises: Stretches   Other Neck Stretches Thoracic extension mobs over FR 10 x 2 sec hold  PT Education - 12/13/19 0818    Education Details HEP    Person(s) Educated Patient    Methods Explanation;Demonstration;Verbal cues    Comprehension Verbalized understanding;Returned demonstration;Verbal cues required;Need further instruction            PT Short Term Goals - 11/22/19 0922      PT SHORT TERM GOAL #1   Title STG = LTG             PT Long Term Goals - 11/22/19 0102      PT LONG TERM GOAL #1   Title Patient will be I with initial HEP to reduce pain and progress postural control    Time 8    Period Weeks    Status New    Target Date 01/17/20      PT LONG TERM GOAL #2   Title Patient will demonstrate appropriate seated posture and will have no limitation or pain with sitting at work    Time 8    Period Weeks    Status New    Target Date 01/17/20      PT LONG TERM GOAL #3   Title Patient will exhibit >/= 4+/5 MMT periscapular strength in order to improve ability to lean forward and play with children without limitation    Time 8    Period Weeks    Status New    Target Date 01/17/20      PT LONG  TERM GOAL #4   Title Patient will demonstrate improved core and hip strength of grossly >/= 4+/5 MMT to reduce pain with lifting and exercise    Time 8    Period Weeks    Status New    Target Date 01/17/20                 Plan - 12/13/19 0819    Clinical Impression Statement Patient tolerated therapy well with no adverse effects. Continued focus on thoracic mobility with core and postural strengthening this visit with good tolerance. She reports continued achines when sitting for extended periods so instructed to change postures frequently and use exercises to improve postural endurance. She would benefit from continued skilled PT to progress her strength, endurance, and mobility in order to improve postural and reduce pain with activity.    PT Treatment/Interventions ADLs/Self Care Home Management;Cryotherapy;Electrical Stimulation;Moist Heat;Therapeutic exercise;Therapeutic activities;Patient/family education;Functional mobility training;Manual techniques;Dry needling;Passive range of motion;Taping;Spinal Manipulations;Joint Manipulations    PT Next Visit Plan Review HEP and progress PRN, core and hip strengthening, progress periscapular strengthening and postural control, thoracic mobility as needed, manual/dry needling as needed for cervical region    PT Home Exercise Plan [email or web address]: prone on physioball I, T, Y, W; row with green; kneeling thoracic extension mob, supine thoracic extension mob over FR, sidelying thoracic rotation mob; dead bug; cervical and mid back stretching exs added    Consulted and Agree with Plan of Care Patient           Patient will benefit from skilled therapeutic intervention in order to improve the following deficits and impairments:  Decreased range of motion, Pain, Decreased activity tolerance, Postural dysfunction, Decreased strength  Visit Diagnosis: Cervicalgia  Pain in thoracic spine  Chronic bilateral low back pain without sciatica  Muscle  weakness (generalized)  Abnormal posture     Problem List Patient Active Problem List   Diagnosis Date Noted   Back pain 11/03/2019   Neck pain 11/03/2019   Elevated lipoprotein(a) 08/17/2019   Goiter 08/17/2019  Symptomatic mammary hypertrophy 08/17/2019   Indication for care in labor or delivery 12/06/2017   Postpartum care following vaginal delivery (10/7) 12/06/2017   SVD (spontaneous vaginal delivery) 12/08/2015    Hilda Blades, PT, DPT, LAT, ATC 12/13/19  9:06 AM Phone: [phone] Fax: [facility] [address] [place], [place], [address] Phone: [phone]   Fax:  [phone]  Name: Kerry Potter MRN: [id number] Date of Birth: [birth year]

## 2019-12-20 ENCOUNTER — Other Ambulatory Visit: Payer: Self-pay

## 2019-12-20 ENCOUNTER — Ambulatory Visit: Payer: 59 | Admitting: Physical Therapy

## 2019-12-20 ENCOUNTER — Encounter: Payer: Self-pay | Admitting: Physical Therapy

## 2019-12-20 DIAGNOSIS — R293 Abnormal posture: Secondary | ICD-10-CM | POA: Diagnosis not present

## 2019-12-20 DIAGNOSIS — G8929 Other chronic pain: Secondary | ICD-10-CM | POA: Diagnosis not present

## 2019-12-20 DIAGNOSIS — M545 Low back pain, unspecified: Secondary | ICD-10-CM | POA: Diagnosis not present

## 2019-12-20 DIAGNOSIS — M546 Pain in thoracic spine: Secondary | ICD-10-CM

## 2019-12-20 DIAGNOSIS — M542 Cervicalgia: Secondary | ICD-10-CM

## 2019-12-20 DIAGNOSIS — M6281 Muscle weakness (generalized): Secondary | ICD-10-CM | POA: Diagnosis not present

## 2019-12-20 NOTE — Therapy (Signed)
[place], [place], [address] Phone: [phone]   Fax:  [phone]  Physical Therapy Treatment  Patient Details  Name: Kerry Potter MRN: [id number] Date of Birth: Dec 28, 1985 Referring Provider (PT): Wallace Going, DO   Encounter Date: 12/20/2019   PT End of Session - 12/20/19 0832    Visit Number 4    Number of Visits 6    Date for PT Re-Evaluation 01/17/20    Authorization Type [facility]    PT Start Time 0828    PT Stop Time 0910    PT Time Calculation (min) 42 min    Activity Tolerance Patient tolerated treatment well    Behavior During Therapy [facility] for tasks assessed/performed           Past Medical History:  Diagnosis Date  . Acute blood loss anemia 12/10/2015  . Medical history non-contributory     Past Surgical History:  Procedure Laterality Date  . NO PAST SURGERIES    . OVARIAN CYST REMOVAL Left 2004    There were no vitals filed for this visit.   Subjective Assessment - 12/20/19 0830    Subjective Patient reports she is doing well. No new issues. She is feeling about the same.    Patient Stated Goals Get stronger to improve posture and reduce pain    Currently in Pain? Yes    Pain Score 3     Pain Location Back    Pain Orientation Mid;Upper;Lower    Pain Descriptors / Indicators Aching    Pain Type Chronic pain    Pain Onset More than a month ago    Pain Frequency Constant    Aggravating Factors  Sitting, bending, exercise              [facility] PT Assessment - 12/20/19 0001      Strength   Overall Strength Comments Periscapular and core strength grossly 4-/5 MMT                         [facility] Adult PT Treatment/Exercise - 12/20/19 0001      Exercises   Exercises Neck;Lumbar      Neck Exercises: Machines for Strengthening   UBE (Upper Arm Bike) L2 x 4 min (2 fwd/bwd)    Cybex Row 35# x 12, 40# 2 x 10      Neck Exercises: Theraband   Rows 15  reps   2 sets   Rows Limitations TRX with high row    Horizontal ABduction 10 reps   2 sets   Horizontal ABduction Limitations yellow    Other Theraband Exercises Diagonals with yellow 2 x 10      Lumbar Exercises: Supine   Dead Bug 10 reps   2 sets   Dead Bug Limitations physioball    Bridge 10 reps;2 seconds   2 sets   Bridge Limitations figure-4 position      Lumbar Exercises: Quadruped   Opposite Arm/Leg Raise 10 reps;3 seconds   2 sets   Plank Plank on physioball: fwd/bwd 2 x 15 sec, stir-the-pot 2 x 15 sec    Other Quadruped Lumbar Exercises Hydrant 2 x 10 each                  PT Education - 12/20/19 0831    Education Details HEP    Person(s) Educated Patient    Methods Explanation;Demonstration;Verbal cues    Comprehension Verbalized understanding;Returned  demonstration;Verbal cues required;Need further instruction            PT Short Term Goals - 11/22/19 0922      PT SHORT TERM GOAL #1   Title STG = LTG             PT Long Term Goals - 11/22/19 0923      PT LONG TERM GOAL #1   Title Patient will be I with initial HEP to reduce pain and progress postural control    Time 8    Period Weeks    Status New    Target Date 01/17/20      PT LONG TERM GOAL #2   Title Patient will demonstrate appropriate seated posture and will have no limitation or pain with sitting at work    Time 8    Period Weeks    Status New    Target Date 01/17/20      PT LONG TERM GOAL #3   Title Patient will exhibit >/= 4+/5 MMT periscapular strength in order to improve ability to lean forward and play with children without limitation    Time 8    Period Weeks    Status New    Target Date 01/17/20      PT LONG TERM GOAL #4   Title Patient will demonstrate improved core and hip strength of grossly >/= 4+/5 MMT to reduce pain with lifting and exercise    Time 8    Period Weeks    Status New    Target Date 01/17/20                 Plan - 12/20/19 7026     Clinical Impression Statement Patient tolerated therapy well with no adverse effects. Progressed strengthening this visit with good tolerance. Patient continues to require cueing for proper posture with exercises. No increase in pain reported with therapy but patient continues to note increased achiness with sitting extended periods. She would benefit from continued skilled PT to progress her strength, endurance, and mobility in order to improve postural and reduce pain with activity.    PT Treatment/Interventions ADLs/Self Care Home Management;Cryotherapy;Electrical Stimulation;Moist Heat;Therapeutic exercise;Therapeutic activities;Patient/family education;Functional mobility training;Manual techniques;Dry needling;Passive range of motion;Taping;Spinal Manipulations;Joint Manipulations    PT Next Visit Plan Review HEP and progress PRN, core and hip strengthening, progress periscapular strengthening and postural control, thoracic mobility as needed, manual/dry needling as needed for cervical region    PT Home Exercise Plan [email or web address]: prone on physioball I, T, Y, W; row with green; kneeling thoracic extension mob, supine thoracic extension mob over FR, sidelying thoracic rotation mob; dead bug; cervical and mid back stretching exs added    Consulted and Agree with Plan of Care Patient           Patient will benefit from skilled therapeutic intervention in order to improve the following deficits and impairments:  Decreased range of motion, Pain, Decreased activity tolerance, Postural dysfunction, Decreased strength  Visit Diagnosis: Cervicalgia  Pain in thoracic spine  Chronic bilateral low back pain without sciatica  Muscle weakness (generalized)  Abnormal posture     Problem List Patient Active Problem List   Diagnosis Date Noted  . Back pain 11/03/2019  . Neck pain 11/03/2019  . 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 Goiter 08/17/2019  . Symptomatic mammary hypertrophy 08/17/2019  .  Indication for care in labor or delivery 12/06/2017  . Postpartum care following vaginal delivery (10/7) 12/06/2017  . SVD (spontaneous vaginal delivery) 12/08/2015  Hilda Blades, PT, DPT, LAT, ATC 12/20/19  9:13 AM Phone: [phone] Fax: [place] [facility] [address] [place], [place], [address] Phone: [phone]   Fax:  [phone]  Name: Kerry Potter MRN: [id number] Date of Birth: Aug 07, 1985

## 2019-12-27 ENCOUNTER — Other Ambulatory Visit: Payer: Self-pay

## 2019-12-27 ENCOUNTER — Ambulatory Visit: Payer: 59 | Admitting: Physical Therapy

## 2019-12-27 ENCOUNTER — Encounter: Payer: Self-pay | Admitting: Physical Therapy

## 2019-12-27 DIAGNOSIS — M542 Cervicalgia: Secondary | ICD-10-CM | POA: Diagnosis not present

## 2019-12-27 DIAGNOSIS — M546 Pain in thoracic spine: Secondary | ICD-10-CM

## 2019-12-27 DIAGNOSIS — M545 Low back pain, unspecified: Secondary | ICD-10-CM | POA: Diagnosis not present

## 2019-12-27 DIAGNOSIS — M6281 Muscle weakness (generalized): Secondary | ICD-10-CM

## 2019-12-27 DIAGNOSIS — G8929 Other chronic pain: Secondary | ICD-10-CM

## 2019-12-27 DIAGNOSIS — R293 Abnormal posture: Secondary | ICD-10-CM | POA: Diagnosis not present

## 2019-12-27 NOTE — Therapy (Signed)
[place], [place], [address] Phone: [phone]   Fax:  [phone]  Physical Therapy Treatment  Patient Details  Name: Kerry Potter MRN: [id number] Date of Birth: 1985-04-15 Referring Provider (PT): Wallace Going, DO   Encounter Date: 12/27/2019   PT End of Session - 12/27/19 0839    Visit Number 5    Number of Visits 6    Date for PT Re-Evaluation 01/17/20    Authorization Type [facility]    PT Start Time 0830    PT Stop Time 0910    PT Time Calculation (min) 40 min    Activity Tolerance Patient tolerated treatment well    Behavior During Therapy [facility] for tasks assessed/performed           Past Medical History:  Diagnosis Date  . Acute blood loss anemia 12/10/2015  . Medical history non-contributory     Past Surgical History:  Procedure Laterality Date  . NO PAST SURGERIES    . OVARIAN CYST REMOVAL Left 2004    There were no vitals filed for this visit.   Subjective Assessment - 12/27/19 0831    Subjective Patient reports she is doing well with no new issues. She continues to report achiness with sitting or activity extended periods.    Patient Stated Goals Get stronger to improve posture and reduce pain    Currently in Pain? Yes    Pain Score 3     Pain Location Back    Pain Orientation Mid;Upper;Lower    Pain Descriptors / Indicators Aching    Pain Type Chronic pain    Pain Onset More than a month ago    Pain Frequency Constant              [facility] PT Assessment - 12/27/19 0001      Strength   Overall Strength Comments Periscapular and core strength grossly 4/5 MMT                         [facility] Adult PT Treatment/Exercise - 12/27/19 0001      Exercises   Exercises Neck;Lumbar      Neck Exercises: Machines for Strengthening   UBE (Upper Arm Bike) L2 x 4 min (2 fwd/bwd)    Cybex Row 45# 2 x 12 vertical handles, 35# 2 x 12 horizontal handles      Neck  Exercises: Standing   Other Standing Exercises Standing A, T with FM 3# 2 x 10 each      Lumbar Exercises: Standing   Lifting 10 reps   2 sets   Lifting Weights (lbs) 30#    Lifting Limitations Dead lift      Lumbar Exercises: Supine   Dead Bug Limitations 2 x 8 each with physioball    Bridge Limitations TRX with hip abduction 2 x 10, marching 2 x10      Lumbar Exercises: Quadruped   Plank Plank on physioball: fwd/bwd 2 x 10, stir-the-pot 2 x 5 each      Neck Exercises: Stretches   Other Neck Stretches Standing throacic extension with physioball on table 2 x 10    Other Neck Stretches Quadruped thoracic rotation reach through to ceiling reach 2 x 5 each                  PT Education - 12/27/19 0834    Education Details HEP    Person(s) Educated Patient  Methods Explanation    Comprehension Verbalized understanding;Need further instruction            PT Short Term Goals - 11/22/19 0922      PT SHORT TERM GOAL #1   Title STG = LTG             PT Long Term Goals - 11/22/19 0923      PT LONG TERM GOAL #1   Title Patient will be I with initial HEP to reduce pain and progress postural control    Time 8    Period Weeks    Status New    Target Date 01/17/20      PT LONG TERM GOAL #2   Title Patient will demonstrate appropriate seated posture and will have no limitation or pain with sitting at work    Time 8    Period Weeks    Status New    Target Date 01/17/20      PT LONG TERM GOAL #3   Title Patient will exhibit >/= 4+/5 MMT periscapular strength in order to improve ability to lean forward and play with children without limitation    Time 8    Period Weeks    Status New    Target Date 01/17/20      PT LONG TERM GOAL #4   Title Patient will demonstrate improved core and hip strength of grossly >/= 4+/5 MMT to reduce pain with lifting and exercise    Time 8    Period Weeks    Status New    Target Date 01/17/20                 Plan -  12/27/19 0911    Clinical Impression Statement Patient tolerated therapy well with no adverse effects. Patient continues to progress well with strengthening exercises and exhibits improve core and postural control this visit. She did not report an increase in back pain but continues to note achiness with extended periods of sitting and activity, after strenuous activity. She would benefit from continued skilled PT to progress her strength, endurance, and mobility in order to improve postural and reduce pain with activity.    PT Treatment/Interventions ADLs/Self Care Home Management;Cryotherapy;Electrical Stimulation;Moist Heat;Therapeutic exercise;Therapeutic activities;Patient/family education;Functional mobility training;Manual techniques;Dry needling;Passive range of motion;Taping;Spinal Manipulations;Joint Manipulations    PT Next Visit Plan Review HEP and progress PRN, core and hip strengthening, progress periscapular strengthening and postural control, thoracic mobility as needed, manual/dry needling as needed for cervical region    PT Home Exercise Plan [email or web address]: prone on physioball I, T, Y, W; row with green; kneeling thoracic extension mob, supine thoracic extension mob over FR, sidelying thoracic rotation mob; dead bug; cervical and mid back stretching exs added    Consulted and Agree with Plan of Care Patient           Patient will benefit from skilled therapeutic intervention in order to improve the following deficits and impairments:  Decreased range of motion, Pain, Decreased activity tolerance, Postural dysfunction, Decreased strength  Visit Diagnosis: Cervicalgia  Pain in thoracic spine  Chronic bilateral low back pain without sciatica  Muscle weakness (generalized)  Abnormal posture     Problem List Patient Active Problem List   Diagnosis Date Noted  . Back pain 11/03/2019  . Neck pain 11/03/2019  . 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 Goiter 08/17/2019  .  Symptomatic mammary hypertrophy 08/17/2019  . Indication for care in labor or delivery 12/06/2017  . Postpartum care following vaginal  delivery (10/7) 12/06/2017  . SVD (spontaneous vaginal delivery) 12/08/2015    Hilda Blades, PT, DPT, LAT, ATC 12/27/19  9:14 AM Phone: [phone] Fax: [place] [facility] [address] [place], [place], [address] Phone: [phone]   Fax:  [phone]  Name: Kerry Potter MRN: [id number] Date of Birth: [birth year]

## 2020-01-03 ENCOUNTER — Ambulatory Visit: Payer: 59 | Attending: Plastic Surgery | Admitting: Physical Therapy

## 2020-01-03 ENCOUNTER — Other Ambulatory Visit: Payer: Self-pay

## 2020-01-03 ENCOUNTER — Encounter: Payer: Self-pay | Admitting: Physical Therapy

## 2020-01-03 DIAGNOSIS — M546 Pain in thoracic spine: Secondary | ICD-10-CM | POA: Diagnosis not present

## 2020-01-03 DIAGNOSIS — R293 Abnormal posture: Secondary | ICD-10-CM | POA: Diagnosis not present

## 2020-01-03 DIAGNOSIS — M6281 Muscle weakness (generalized): Secondary | ICD-10-CM | POA: Diagnosis not present

## 2020-01-03 DIAGNOSIS — M542 Cervicalgia: Secondary | ICD-10-CM | POA: Insufficient documentation

## 2020-01-03 DIAGNOSIS — G8929 Other chronic pain: Secondary | ICD-10-CM | POA: Diagnosis not present

## 2020-01-03 DIAGNOSIS — M545 Low back pain, unspecified: Secondary | ICD-10-CM | POA: Insufficient documentation

## 2020-01-03 NOTE — Patient Instructions (Signed)
Access Code: [email or web address] URL: [email or web address] Date: 01/03/2020 Prepared by: Hilda Blades  Exercises Prone Shoulder Extension on Swiss Ball - 2 sets - 10 reps - 2-3 seconds hold Prone Middle Trapezius Strengthening on Swiss Ball - 2 sets - 10 reps - 2-3 seconds hold Prone Lower Trapezius Strengthening on Swiss Ball with Dumbbells - 2 sets - 10 reps - 2-3 seconds hold Prone Shoulder W on [employer] - 2 sets - 10 reps - 2-3 seconds hold Standing Bilateral Low Shoulder Row with Anchored Resistance - 2 sets - 15 reps Thoracic Extension Mobilization on Foam Roll - 10 reps - 2-3 seconds hold Sidelying Thoracic and Shoulder Rotation - 10 reps - 2-3 seconds hold Prone Chest Stretch on Chair - 10 reps - 2-3 seconds hold Seated Scapular Retraction - 5-10 reps - 3 seconds hold Seated Cervical Retraction - 5-10 reps - 3 seconds hold Seated Gentle Upper Trapezius Stretch - 3 reps - 20 seconds hold Gentle Levator Scapulae Stretch - 3 reps - 20 seconds hold Doorway Pec Stretch at 90 Degrees Abduction - 3 reps - 20 seconds hold Doorway Rhomboid Stretch - 3 reps - 20 seconds hold Supine Dead Bug with Leg Extension - 3 sets - 10 reps Figure 4 Bridge - 3 sets - 10 reps - 3 seconds hold Quadruped Full Range Thoracic Rotation with Reach - 1 sets - 5-10 reps Cat-Camel to Child's Pose - 1 sets - 5-10 reps Bird Dog - 3 sets - 10 reps - 3 seconds hold Quadruped Ambulance person - 3 sets - 10 reps Prone Plank Elbows on Ball - 3-5 reps - 20 seconds hold Side Plank with Clam - 3 sets - 10 reps Low Row with TRX&Acirc;&reg; Glute Bridge with TRX Single Leg Hamstring Curl with TRX Ab Crunch Reach Out with TRX&Acirc;&reg; Knee Tucks with TRX Suspended Plank with TRX

## 2020-01-03 NOTE — Therapy (Signed)
[place], [place], [address] Phone: [phone]   Fax:  [phone]  Physical Therapy Treatment / Discharge  Patient Details  Name: Kerry Potter MRN: [id number] Date of Birth: [birth year] Referring Provider (PT): Wallace Going, DO   Encounter Date: 01/03/2020   PT End of Session - 01/03/20 0840    Visit Number 6    Number of Visits 6    Date for PT Re-Evaluation 01/17/20    Authorization Type [facility]    PT Start Time 0831    PT Stop Time 0910    PT Time Calculation (min) 39 min    Activity Tolerance Patient tolerated treatment well    Behavior During Therapy [facility] for tasks assessed/performed           Past Medical History:  Diagnosis Date  . Acute blood loss anemia 12/10/2015  . Medical history non-contributory     Past Surgical History:  Procedure Laterality Date  . NO PAST SURGERIES    . OVARIAN CYST REMOVAL Left 2004    There were no vitals filed for this visit.   Subjective Assessment - 01/03/20 0835    Subjective Patient reports she is doing well but staying about the same in regard to achiness with activities and sustained postures.    Limitations Sitting;Standing;House hold activities;Lifting    How long can you sit comfortably? "Can sit for a few house then needs to stand and move around"    How long can you stand comfortably? "Only when goes from sitting to standing"    How long can you walk comfortably? No limitation    Diagnostic tests None performed    Patient Stated Goals Get stronger to improve posture and reduce pain    Currently in Pain? Yes    Pain Score 3     Pain Location Back    Pain Orientation Lower;Mid;Upper    Pain Descriptors / Indicators Aching    Pain Type Chronic pain    Pain Onset More than a month ago    Pain Frequency Constant    Aggravating Factors  Sitting, bending, exercise    Effect of Pain on Daily Activities Patient feels limited playing with  her kids and ability to exercise, sitting at work              [facility] PT Assessment - 01/03/20 0001      Assessment   Medical Diagnosis Symptomatic mammary hypertrophy    Referring Provider (PT) Dillingham, Loel Lofty, DO    Next MD Visit Will schedule follow-up after PT      Precautions   Precautions None      Restrictions   Weight Bearing Restrictions No      Balance Screen   Has the patient fallen in the past 6 months No      Prior Function   Level of Independence Independent    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, 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    Leisure Lifting at gym      Observation/Other Assessments   Focus on Therapeutic Outcomes (FOTO)  NA - inappropriate diagnosis      Posture/Postural Control   Posture Comments Patient exhibits mild rounded shoulder and mild forward head posture, worse with sitting, able to correct when cued      AROM   Overall AROM Comments Shoulder, cervical, and lumbar AROM grossly WFL and non-painful    Thoracic Extension [facility]    Thoracic - Right  Rotation Slightly limited    Thoracic - Left Rotation Slightly limited      Strength   Overall Strength Comments Periscapular and core strength grossly 4/5 MMT                         [facility] Adult PT Treatment/Exercise - 01/03/20 0001      Exercises   Exercises Neck;Lumbar   reviewed current HEP and progressions using physioball/TRX     Neck Exercises: Machines for Strengthening   UBE (Upper Arm Bike) L2 x 4 min (2 fwd/bwd)      Neck Exercises: Theraband   Rows 15 reps;Blue      Neck Exercises: Prone   Other Prone Exercise Prone I, T, Y, W on physioball with knees down; 10 reps each       Lumbar Exercises: Supine   Dead Bug 10 reps    Bridge 10 reps    Bridge Limitations figure-4 position      Lumbar Exercises: Quadruped   Madcat/Old Horse 10 reps    Opposite Arm/Leg Raise 10 reps;3 seconds      Neck Exercises: Stretches   Other Neck Stretches Quadruped thoracic  rotation reach through to ceiling reach 2 x 5 each                  PT Education - 01/03/20 1126    Education Details HEP, POC for discharge    Person(s) Educated Patient    Methods Explanation;Demonstration    Comprehension Verbalized understanding;Returned demonstration            PT Short Term Goals - 11/22/19 0922      PT SHORT TERM GOAL #1   Title STG = LTG             PT Long Term Goals - 01/03/20 1336      PT LONG TERM GOAL #1   Title Patient will be I with initial HEP to reduce pain and progress postural control    Time 8    Period Weeks    Status Achieved      PT LONG TERM GOAL #2   Title Patient will demonstrate appropriate seated posture and will have no limitation or pain with sitting at work    Baseline Patient continues to report achiness while sitting extended periods, able to demonstrate proper posture    Time 8    Period Weeks    Status Partially Met      PT LONG TERM GOAL #3   Title Patient will exhibit >/= 4+/5 MMT periscapular strength in order to improve ability to lean forward and play with children without limitation    Baseline Patient continues to exhibit 4/5 MMT periscapular strength    Time 8    Period Weeks    Status Partially Met      PT LONG TERM GOAL #4   Title Patient will demonstrate improved core and hip strength of grossly >/= 4+/5 MMT to reduce pain with lifting and exercise    Baseline Patient exhibits gross core and hip strength 4/5 MMT    Time 8    Period Weeks    Status Partially Met                 Plan - 01/03/20 1128    Clinical Impression Statement Patient has completed 6 PT visits and reports persistent upper, mid, and lower back achiness with activity and extended periods of static posture such  as sitting. She is independent with HEP and this visit provided her progression for current exercises and other options she could perform while at the gym to continue postural and core strengthening. Patient  will be formally discharged from PT to independent HEP and was instructed to follow-up with her doctor PRN.    Examination-Activity Limitations Sit;Lift;Bend;Caring for Others    PT Treatment/Interventions ADLs/Self Care Home Management;Cryotherapy;Electrical Stimulation;Moist Heat;Therapeutic exercise;Therapeutic activities;Patient/family education;Functional mobility training;Manual techniques;Dry needling;Passive range of motion;Taping;Spinal Manipulations;Joint Manipulations    PT Next Visit Plan NA - DC    PT Home Exercise Plan [id number]: see patient instructions    Consulted and Agree with Plan of Care Patient           Patient will benefit from skilled therapeutic intervention in order to improve the following deficits and impairments:  Decreased range of motion, Pain, Decreased activity tolerance, Postural dysfunction, Decreased strength  Visit Diagnosis: Cervicalgia  Pain in thoracic spine  Chronic bilateral low back pain without sciatica  Muscle weakness (generalized)  Abnormal posture     Problem List Patient Active Problem List   Diagnosis Date Noted  . Back pain 11/03/2019  . Neck pain 11/03/2019  . 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 Goiter 08/17/2019  . Symptomatic mammary hypertrophy 08/17/2019  . Indication for care in labor or delivery 12/06/2017  . Postpartum care following vaginal delivery (10/7) 12/06/2017  . SVD (spontaneous vaginal delivery) 12/08/2015    PHYSICAL THERAPY DISCHARGE SUMMARY  Visits from Start of Care: 6  Current functional level related to goals / functional outcomes: See above    Remaining deficits: See above   Education / Equipment: HEP  Plan: Patient agrees to discharge.  Patient goals were partially met. Patient is being discharged due to lack of progress.  ?????     Hilda Blades, PT, DPT, LAT, ATC 01/03/20  1:43 PM Phone: [phone] Fax: [place] [facility] [address] [place], [place], [address] Phone: [phone]   Fax:  [phone]  Name: Kerry Potter MRN: [id number] Date of Birth: [birth year]

## 2020-01-10 ENCOUNTER — Encounter: Payer: 59 | Admitting: Physical Therapy

## 2020-09-10 ENCOUNTER — Other Ambulatory Visit: Payer: Self-pay

## 2020-09-10 ENCOUNTER — Ambulatory Visit (INDEPENDENT_AMBULATORY_CARE_PROVIDER_SITE_OTHER): Payer: 59 | Admitting: Plastic Surgery

## 2020-09-10 ENCOUNTER — Encounter: Payer: Self-pay | Admitting: Plastic Surgery

## 2020-09-10 VITALS — Wt 176.8 lb

## 2020-09-10 DIAGNOSIS — N62 Hypertrophy of breast: Secondary | ICD-10-CM

## 2020-09-10 DIAGNOSIS — M546 Pain in thoracic spine: Secondary | ICD-10-CM

## 2020-09-10 DIAGNOSIS — M542 Cervicalgia: Secondary | ICD-10-CM | POA: Diagnosis not present

## 2020-09-10 DIAGNOSIS — G8929 Other chronic pain: Secondary | ICD-10-CM | POA: Diagnosis not present

## 2020-09-10 NOTE — Progress Notes (Signed)
Patient ID: Kerry Potter, female    DOB: 08/15/1985, 35 y.o.   MRN: [id number]   Chief Complaint  Patient presents with   Follow-up    Mammary Hyperplasia: The patient is a 35 y.o. female with a history of mammary hyperplasia for several years.  She has extremely large breasts causing symptoms that include the following: Back pain in the upper and lower back, including neck pain. She pulls or pins her bra straps to provide better lift and relief of the pressure and pain. She notices relief by holding her breast up manually.  Her shoulder straps cause grooves and pain and pressure that requires padding for relief. Pain medication is sometimes required with motrin and tylenol.  Activities that are hindered by enlarged breasts include: exercise and running.  She has tried supportive clothing as well as fitted bras without improvement.  Her breasts are extremely large and fairly symmetric.  She has hyperpigmentation of the inframammary area on both sides.  The sternal to nipple distance on the right is 30 cm and the left is 30 cm.  The IMF distance is 17 cm.  She is 5 feet 6 inches tall and weighs 180 pounds.  The BMI = 29.1.  Preoperative bra size = DD cup.  The estimated excess breast tissue to be removed at the time of surgery = 575 grams on the left and 575 grams on the right.  Mammogram history: negative 9/21.  Family history of breast cancer: None.  Tobacco use: None.   The patient expresses the desire to pursue surgical intervention.  She has finished physical therapy and has not had improvement in her neck and back pain.  If anything the patient feels like it is gotten worse.    Review of Systems  Constitutional:  Positive for activity change. Negative for appetite change.  HENT: Negative.    Eyes: Negative.   Respiratory: Negative.  Negative for chest tightness and shortness of breath.   Cardiovascular:  Negative for leg swelling.  Gastrointestinal:  Negative for abdominal distention.   Endocrine: Negative.   Genitourinary: Negative.   Musculoskeletal:  Positive for back pain and neck pain.  Skin:  Positive for rash.  Neurological: Negative.   Hematological: Negative.   Psychiatric/Behavioral: Negative.     Past Medical History:  Diagnosis Date   Acute blood loss anemia 12/10/2015   Medical history non-contributory     Past Surgical History:  Procedure Laterality Date   NO PAST SURGERIES     OVARIAN CYST REMOVAL Left 2004      Current Outpatient Medications:    acetaminophen (TYLENOL) 500 MG tablet, Take 2 tablets (1,000 mg total) by mouth every 6 (six) hours as needed for mild pain (for pain scale < 4)., Disp: 30 tablet, Rfl: 0   levonorgestrel (MIRENA) 20 MCG/24HR IUD, 1 each by Intrauterine route once., Disp: , Rfl:    Objective:   There were no vitals filed for this visit.  Physical Exam Vitals and nursing note reviewed.  Constitutional:      Appearance: Normal appearance.  HENT:     Head: Normocephalic and atraumatic.  Cardiovascular:     Rate and Rhythm: Normal rate.     Pulses: Normal pulses.  Pulmonary:     Effort: Pulmonary effort is normal. No respiratory distress.     Breath sounds: No wheezing.  Abdominal:     General: Abdomen is flat. There is no distension.  Musculoskeletal:  General: No swelling or deformity.  Skin:    General: Skin is warm.     Coloration: Skin is not jaundiced.     Findings: Rash present. No bruising or lesion.  Neurological:     General: No focal deficit present.     Mental Status: She is alert.    Assessment & Plan:  Symptomatic mammary hypertrophy  Neck pain  Chronic bilateral thoracic back pain  The patient is a good candidate for bilateral breast reduction with possible liposuction.  We will submit to insurance. The patient also asked for information for Botox, filler and abdominoplasty with liposuction. Pictures were obtained of the patient and placed in the chart with the patient's or  guardian's permission.   [place], DO

## 2020-10-17 ENCOUNTER — Other Ambulatory Visit: Payer: Self-pay

## 2020-10-18 ENCOUNTER — Ambulatory Visit (INDEPENDENT_AMBULATORY_CARE_PROVIDER_SITE_OTHER): Payer: 59 | Admitting: Family Medicine

## 2020-10-18 ENCOUNTER — Encounter: Payer: Self-pay | Admitting: Family Medicine

## 2020-10-18 ENCOUNTER — Ambulatory Visit: Payer: 59 | Admitting: Family Medicine

## 2020-10-18 VITALS — BP 116/78 | HR 77 | Temp 98.6°F | Ht 66.5 in | Wt 173.6 lb

## 2020-10-18 DIAGNOSIS — Z8639 Personal history of other endocrine, nutritional and metabolic disease: Secondary | ICD-10-CM | POA: Diagnosis not present

## 2020-10-18 DIAGNOSIS — Z131 Encounter for screening for diabetes mellitus: Secondary | ICD-10-CM | POA: Diagnosis not present

## 2020-10-18 DIAGNOSIS — Z0000 Encounter for general adult medical examination without abnormal findings: Secondary | ICD-10-CM | POA: Diagnosis not present

## 2020-10-18 DIAGNOSIS — Z13220 Encounter for screening for lipoid disorders: Secondary | ICD-10-CM

## 2020-10-18 LAB — CBC WITH DIFFERENTIAL/PLATELET
Basophils Absolute: 0.1 10*3/uL (ref 0.0–0.1)
Basophils Relative: 0.7 % (ref 0.0–3.0)
Eosinophils Absolute: 0.2 10*3/uL (ref 0.0–0.7)
Eosinophils Relative: 2.4 % (ref 0.0–5.0)
HCT: 39.4 % (ref 36.0–46.0)
Hemoglobin: 13.3 g/dL (ref 12.0–15.0)
Lymphocytes Relative: 35.9 % (ref 12.0–46.0)
Lymphs Abs: 2.8 10*3/uL (ref 0.7–4.0)
MCHC: 33.7 g/dL (ref 30.0–36.0)
MCV: 88.6 fl (ref 78.0–100.0)
Monocytes Absolute: 0.5 10*3/uL (ref 0.1–1.0)
Monocytes Relative: 6.5 % (ref 3.0–12.0)
Neutro Abs: 4.2 10*3/uL (ref 1.4–7.7)
Neutrophils Relative %: 54.5 % (ref 43.0–77.0)
Platelets: 222 10*3/uL (ref 150.0–400.0)
RBC: 4.44 Mil/uL (ref 3.87–5.11)
RDW: 12.7 % (ref 11.5–15.5)
WBC: 7.8 10*3/uL (ref 4.0–10.5)

## 2020-10-18 LAB — T4, FREE: Free T4: 0.79 ng/dL (ref 0.60–1.60)

## 2020-10-18 LAB — LIPID PANEL
Cholesterol: 158 mg/dL (ref 0–200)
HDL: 48.2 mg/dL (ref >39.00)
LDL Cholesterol: 98 mg/dL (ref 0–99)
NonHDL: 109.6
Total CHOL/HDL Ratio: 3
Triglycerides: 60 mg/dL (ref 0.0–149.0)
VLDL: 12 mg/dL (ref 0.0–40.0)

## 2020-10-18 LAB — BASIC METABOLIC PANEL
BUN: 13 mg/dL (ref 6–23)
CO2: 26 mEq/L (ref 19–32)
Calcium: 9.4 mg/dL (ref 8.4–10.5)
Chloride: 103 mEq/L (ref 96–112)
Creatinine, Ser: 0.65 mg/dL (ref 0.40–1.20)
GFR: 115.52 mL/min (ref >60.00)
Glucose, Bld: 73 mg/dL (ref 70–99)
Potassium: 3.8 mEq/L (ref 3.5–5.1)
Sodium: 138 mEq/L (ref 135–145)

## 2020-10-18 LAB — TSH: TSH: 1.1 u[IU]/mL (ref 0.35–5.50)

## 2020-10-18 LAB — HEMOGLOBIN A1C: Hgb A1c MFr Bld: 5.3 % (ref 4.6–6.5)

## 2020-10-18 NOTE — Progress Notes (Signed)
Subjective:     Kerry Potter is a 35 y.o. female and is here for a comprehensive physical exam. The patient reports no problems. Pt doing well.  Enjoying time with family.  Working on diet and exercise.  Weight went up to 195lbs over the pandemic.  Pt considering genetic testing as her mother had Ovarian Cancer at age 32.   Social History   Socioeconomic History   Marital status: Married    Spouse name: Not on file   Number of children: 2   Years of education: Not on file   Highest education level: Not on file  Occupational History   Not on file  Tobacco Use   Smoking status: Never   Smokeless tobacco: Never  Vaping Use   Vaping Use: Never used  Substance and Sexual Activity   Alcohol use: Yes    Comment: social   Drug use: No   Sexual activity: Yes    Birth control/protection: I.U.D.  Other Topics Concern   Not on file  Social History Narrative   Not on file   Social Determinants of Health   Financial Resource Strain: Not on file  Food Insecurity: Not on file  Transportation Needs: Not on file  Physical Activity: Not on file  Stress: Not on file  Social Connections: Not on file  Intimate Partner Violence: Not on file   Health Maintenance  Topic Date Due   Hepatitis C Screening  Never done   INFLUENZA VACCINE  09/30/2020   COVID-19 Vaccine (1) 11/03/2020 (Originally 03/06/1990)   PAP SMEAR-Modifier  01/28/2021   TETANUS/TDAP  09/08/2023   HIV Screening  Completed   Pneumococcal Vaccine 52-61 Years old  Aged Out   HPV VACCINES  Aged Out    The following portions of the patient's history were reviewed and updated as appropriate: allergies, current medications, past family history, past medical history, past social history, past surgical history, and problem list.  Review of Systems Pertinent items noted in HPI and remainder of comprehensive ROS otherwise negative.   Objective:    BP 116/78 (BP Location: Right Arm, Patient Position: Sitting, Cuff Size: Normal)    Pulse 77   Temp 98.6 F (37 C) (Oral)   Ht 5' 6.5" (1.689 m)   Wt 173 lb 9.6 oz (78.7 kg)   SpO2 97%   BMI 27.60 kg/m  General appearance: alert, cooperative, and no distress Head: Normocephalic, without obvious abnormality, atraumatic Eyes: conjunctivae/corneas clear. PERRL, EOM's intact. Fundi benign. Ears: normal TM's and external ear canals both ears Nose: Nares normal. Septum midline. Mucosa normal. No drainage or sinus tenderness. Throat: lips, mucosa, and tongue normal; teeth and gums normal Neck: no adenopathy, no carotid bruit, no JVD, supple, symmetrical, trachea midline, and thyroid not enlarged, symmetric, no tenderness/mass/nodules Lungs: clear to auscultation bilaterally Heart: regular rate and rhythm, S1, S2 normal, no murmur, click, rub or gallop Abdomen: soft, non-tender; bowel sounds normal; no masses,  no organomegaly Extremities: extremities normal, atraumatic, no cyanosis or edema Pulses: 2+ and symmetric Skin: Skin color, texture, turgor normal. No rashes or lesions Lymph nodes: Cervical, supraclavicular, and axillary nodes normal. Neurologic: Alert and oriented X 3, normal strength and tone. Normal symmetric reflexes. Normal coordination and gait    Assessment:    Healthy female exam.      Plan:    Anticipatory guidance given including wearing seatbelts, smoke detectors in the home, increasing physical activity, increasing p.o. intake of water and vegetables. -labs -continue f/u with OB/gyn for pap -  influenza vaccine when available -pt to contact clinic if interested in genetic testing. -next CPE in 1 yr See After Visit Summary for Counseling Recommendations   Screening for cholesterol level  -lifestyle modifications - Plan: Lipid panel  Screening for diabetes mellitus  - Plan: Hemoglobin A1c  History of goiter  - Plan: TSH, T4, Free  F/u prn  Grier Mitts, MD

## 2020-10-30 ENCOUNTER — Encounter: Payer: Self-pay | Admitting: Family Medicine

## 2020-10-31 ENCOUNTER — Encounter: Payer: Self-pay | Admitting: Family Medicine

## 2020-10-31 ENCOUNTER — Other Ambulatory Visit (HOSPITAL_COMMUNITY): Payer: Self-pay

## 2020-10-31 ENCOUNTER — Telehealth: Payer: 59 | Admitting: Family Medicine

## 2020-10-31 DIAGNOSIS — R300 Dysuria: Secondary | ICD-10-CM | POA: Diagnosis not present

## 2020-10-31 LAB — POCT URINALYSIS DIPSTICK
Bilirubin, UA: NEGATIVE
Blood, UA: NEGATIVE
Glucose, UA: NEGATIVE
Ketones, UA: NEGATIVE
Protein, UA: NEGATIVE
Spec Grav, UA: 1.02 (ref 1.010–1.025)
Urobilinogen, UA: NEGATIVE E.U./dL — AB
pH, UA: 6 (ref 5.0–8.0)

## 2020-10-31 MED ORDER — NITROFURANTOIN MONOHYD MACRO 100 MG PO CAPS
100.0000 mg | ORAL_CAPSULE | Freq: Two times a day (BID) | ORAL | 0 refills | Status: AC
  Filled 2020-10-31: qty 14, 7d supply, fill #0

## 2020-10-31 NOTE — Progress Notes (Signed)
Virtual Visit via Telephone Note  I connected with Kerry Potter on 10/31/20 at  4:00 PM EDT by telephone and verified that I am speaking with the correct person using two identifiers.   I discussed the limitations, risks, security and privacy concerns of performing an evaluation and management service by telephone and the availability of in person appointments. I also discussed with the patient that there may be a patient responsible charge related to this service. The patient expressed understanding and agreed to proceed.  Location patient: home Location provider: work or home office Participants present for the call: patient, provider Patient did not have a visit in the prior 7 days to address this/these issue(s).  History of Present Illness: Pt endorses urinary discomfort since 8/22.  Symptoms worse at night. Initially seemed to improve, then returned.  Tried OTC Azo, cranberry pills, cranberry juice.  OTC urine test positive for leuks and negative for nitrite.  Though symptoms have improved some pt can still tell that they are there.  Notes discomfort when trying to start a stream.   Observations/Objective: Patient sounds cheerful and well on the phone. I do not appreciate any SOB. Speech and thought processing are grossly intact. Patient reported vitals:  Assessment and Plan: Dysuria -Continue supportive care including increasing p.o. intake of water and fluids -Given precautions - Plan: POCT urinalysis dipstick, Culture, Urine, nitrofurantoin, macrocrystal-monohydrate, (MACROBID) 100 MG capsule   Follow Up Instructions:  As needed   99441 5-10 99442 11-20 9443 21-30 I did not refer this patient for an OV in the next 24 hours for this/these issue(s).  I discussed the assessment and treatment plan with the patient. The patient was provided an opportunity to ask questions and all were answered. The patient agreed with the plan and demonstrated an understanding of the  instructions.   The patient was advised to call back or seek an in-person evaluation if the symptoms worsen or if the condition fails to improve as anticipated.  I provided 7 minutes of non-face-to-face time during this encounter.   Billie Ruddy, MD

## 2020-12-09 DIAGNOSIS — Z6828 Body mass index (BMI) 28.0-28.9, adult: Secondary | ICD-10-CM | POA: Diagnosis not present

## 2020-12-09 DIAGNOSIS — Z30431 Encounter for routine checking of intrauterine contraceptive device: Secondary | ICD-10-CM | POA: Diagnosis not present

## 2020-12-09 DIAGNOSIS — Z124 Encounter for screening for malignant neoplasm of cervix: Secondary | ICD-10-CM | POA: Diagnosis not present

## 2020-12-09 DIAGNOSIS — Z01419 Encounter for gynecological examination (general) (routine) without abnormal findings: Secondary | ICD-10-CM | POA: Diagnosis not present

## 2020-12-09 DIAGNOSIS — Z842 Family history of other diseases of the genitourinary system: Secondary | ICD-10-CM | POA: Diagnosis not present

## 2020-12-09 DIAGNOSIS — Z7689 Persons encountering health services in other specified circumstances: Secondary | ICD-10-CM | POA: Diagnosis not present

## 2020-12-09 DIAGNOSIS — Z01411 Encounter for gynecological examination (general) (routine) with abnormal findings: Secondary | ICD-10-CM | POA: Diagnosis not present

## 2020-12-09 DIAGNOSIS — Z113 Encounter for screening for infections with a predominantly sexual mode of transmission: Secondary | ICD-10-CM | POA: Diagnosis not present

## 2020-12-17 ENCOUNTER — Telehealth: Payer: Self-pay | Admitting: Genetic Counselor

## 2020-12-17 NOTE — Telephone Encounter (Signed)
Scheduled appt per 10/14 referral. Pt is aware of appt date and time. Pt did request to sch appt in February of next year.

## 2020-12-20 ENCOUNTER — Encounter: Payer: 59 | Admitting: Physician Assistant

## 2021-01-06 DIAGNOSIS — Z842 Family history of other diseases of the genitourinary system: Secondary | ICD-10-CM | POA: Diagnosis not present

## 2021-01-10 ENCOUNTER — Encounter: Payer: 59 | Admitting: Plastic Surgery

## 2021-01-21 ENCOUNTER — Encounter: Payer: 59 | Admitting: Physician Assistant

## 2021-02-27 ENCOUNTER — Telehealth: Payer: Self-pay

## 2021-02-27 NOTE — Telephone Encounter (Signed)
Patient called to say she is scheduled for surgery on 03/27/2021 and her pre-op will be on 03/11/2021.  She said she would like a list of any post-op supplies she should be buying such as gauze, bandages, special bras, so she can start gathering those things.  Patient asked that we  let her know a little bit about the after surgery care items she might need.  Please call.

## 2021-02-27 NOTE — Telephone Encounter (Signed)
Called pt, na, left detailed vm as pt's name was stated on voicemail introduction.   Adv pt to not and buy any post-op supplies such as guaze, tape, etc. Those supplies are mostly used if a patient develops a wound during the healing process. We won't know if she'll have a wound until she starts to heal and develops one.   Also adv that she'll need a sports bra for compression purposes and this will need to be worn at least 6 weeks if not longer. Adv not to buy the bra until after she has the surgery to make sure that it is the right level of compression. She will be wrapped in a binder immediately after surgery that she can use repeatedly until she purchases her own.

## 2021-03-07 ENCOUNTER — Encounter: Payer: 59 | Admitting: Surgical

## 2021-03-10 DIAGNOSIS — Z30433 Encounter for removal and reinsertion of intrauterine contraceptive device: Secondary | ICD-10-CM | POA: Diagnosis not present

## 2021-03-10 DIAGNOSIS — Z3202 Encounter for pregnancy test, result negative: Secondary | ICD-10-CM | POA: Diagnosis not present

## 2021-03-10 DIAGNOSIS — Z30430 Encounter for insertion of intrauterine contraceptive device: Secondary | ICD-10-CM | POA: Diagnosis not present

## 2021-03-10 DIAGNOSIS — T8339XA Other mechanical complication of intrauterine contraceptive device, initial encounter: Secondary | ICD-10-CM | POA: Diagnosis not present

## 2021-03-11 ENCOUNTER — Ambulatory Visit (INDEPENDENT_AMBULATORY_CARE_PROVIDER_SITE_OTHER): Payer: 59 | Admitting: Surgical

## 2021-03-11 ENCOUNTER — Other Ambulatory Visit (HOSPITAL_COMMUNITY): Payer: Self-pay

## 2021-03-11 ENCOUNTER — Encounter: Payer: Self-pay | Admitting: Surgical

## 2021-03-11 ENCOUNTER — Other Ambulatory Visit: Payer: Self-pay

## 2021-03-11 ENCOUNTER — Encounter: Payer: 59 | Admitting: Surgical

## 2021-03-11 VITALS — BP 105/59 | HR 90 | Ht 66.0 in | Wt 177.2 lb

## 2021-03-11 DIAGNOSIS — M542 Cervicalgia: Secondary | ICD-10-CM

## 2021-03-11 DIAGNOSIS — N62 Hypertrophy of breast: Secondary | ICD-10-CM

## 2021-03-11 DIAGNOSIS — G8929 Other chronic pain: Secondary | ICD-10-CM

## 2021-03-11 DIAGNOSIS — M546 Pain in thoracic spine: Secondary | ICD-10-CM

## 2021-03-11 MED ORDER — HYDROCODONE-ACETAMINOPHEN 5-325 MG PO TABS
1.0000 | ORAL_TABLET | Freq: Four times a day (QID) | ORAL | 0 refills | Status: AC | PRN
  Filled 2021-03-11: qty 20, 5d supply, fill #0

## 2021-03-11 MED ORDER — CEPHALEXIN 500 MG PO CAPS
500.0000 mg | ORAL_CAPSULE | Freq: Four times a day (QID) | ORAL | 0 refills | Status: AC
  Filled 2021-03-11: qty 12, 3d supply, fill #0

## 2021-03-11 MED ORDER — ONDANSETRON HCL 4 MG PO TABS
4.0000 mg | ORAL_TABLET | Freq: Three times a day (TID) | ORAL | 0 refills | Status: AC | PRN
  Filled 2021-03-11: qty 20, 7d supply, fill #0

## 2021-03-11 NOTE — Progress Notes (Signed)
Patient ID: Kerry Potter, female    DOB: 02-10-1986, 36 y.o.   MRN: [id number]  Chief Complaint  Patient presents with   Pre-op Exam      ICD-10-CM   1. Symptomatic mammary hypertrophy  N62     2. Neck pain  M54.2     3. Chronic bilateral thoracic back pain  M54.6    G89.29        History of Present Illness: Kerry Potter is a 36 y.o.  female  with a history of macromastia.  She presents for preoperative evaluation for upcoming procedure, Bilateral Breast Reduction with liposuction and excision of mole on face, scheduled for 03/27/2021 with Dr.  Marla Roe  The patient has not had problems with anesthesia. No history of DVT/PE.  No family history of DVT/PE.  No family or personal history of bleeding or clotting disorders.  Patient is not currently taking any blood thinners.  No history of CVA/MI.   Summary of Previous Visit: STN on the right is 30 cm and the left is 30 cm.  Preoperative bra size equals DD cup.  Patient had a mammogram on 9/21 which was negative.  No tobacco use.  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  PMH Significant for: Currently has Mirena IUD in place.  Patient reports she is feeling well, no recent changes to her health.  She is not having any infectious symptoms.  She is excited for breast reduction surgery.  She reports that she would like to be a large C cup if possible.  Past Medical History: Allergies: Allergies  Allergen Reactions   Mobic [Meloxicam] Diarrhea    Current Medications:  Current Outpatient Medications:    cephALEXin (KEFLEX) 500 MG capsule, Take 1 capsule (500 mg total) by mouth 4 (four) times daily for 3 days., Disp: 12 capsule, Rfl: 0   HYDROcodone-acetaminophen (NORCO) 5-325 MG tablet, Take 1 tablet by mouth every 6 (six) hours as needed for up to 5 days for  severe pain., Disp: 20 tablet, Rfl: 0   levonorgestrel (MIRENA) 20 MCG/24HR IUD, 1 each by Intrauterine route once., Disp: , Rfl:    ondansetron (ZOFRAN) 4 MG tablet, Take 1 tablet (4 mg total) by mouth every 8 (eight) hours as needed for nausea or vomiting., Disp: 20 tablet, Rfl: 0   acetaminophen (TYLENOL) 500 MG tablet, Take 2 tablets (1,000 mg total) by mouth every 6 (six) hours as needed for mild pain (for pain scale < 4). (Patient not taking: Reported on 03/11/2021), Disp: 30 tablet, Rfl: 0  Past Medical Problems: Past Medical History:  Diagnosis Date   Acute blood loss anemia 12/10/2015   Medical history non-contributory     Past Surgical History: Past Surgical History:  Procedure Laterality Date   NO PAST SURGERIES     OVARIAN CYST REMOVAL Left 2004    Social History: Social History   Socioeconomic History   Marital status: Married    Spouse name: Not on file   Number of children: 2   Years of education: Not on file   Highest education level: Not on file  Occupational History   Not on file  Tobacco Use   Smoking status: Never   Smokeless tobacco: Never  Vaping Use   Vaping Use: Never used  Substance and Sexual Activity   Alcohol use:  Yes    Comment: social   Drug use: No   Sexual activity: Yes    Birth control/protection: I.U.D.  Other Topics Concern   Not on file  Social History Narrative   Not on file   Social Determinants of Health   Financial Resource Strain: Not on file  Food Insecurity: Not on file  Transportation Needs: Not on file  Physical Activity: Not on file  Stress: Not on file  Social Connections: Not on file  Intimate Partner Violence: Not on file    Family History: Family History  Problem Relation Age of Onset   Ovarian cancer Mother    Hypertension Father    Hypertension Paternal Grandmother    Heart disease Paternal Grandfather    Hypertension Paternal Grandfather    Diabetes Paternal Grandfather     Review of  Systems: Review of Systems  Constitutional: Negative.   Respiratory: Negative.    Cardiovascular: Negative.   Gastrointestinal: Negative.   Neurological: Negative.    Physical Exam: Vital Signs BP (!) 105/59 (BP Location: Left Arm, Patient Position: Sitting, Cuff Size: Large)    Pulse 90    Ht 5\' 6"  (1.676 m)    Wt 177 lb 3.2 oz (80.4 kg)    SpO2 97%    BMI 28.60 kg/m   Physical Exam  Constitutional:      General: Not in acute distress.    Appearance: Normal appearance. Not ill-appearing.  HENT:     Head: Normocephalic and atraumatic.  Eyes:     Pupils: Pupils are equal, round Neck:     Musculoskeletal: Normal range of motion.  Cardiovascular:     Rate and Rhythm: Normal rate    Pulses: Normal pulses.  Pulmonary:     Effort: Pulmonary effort is normal. No respiratory distress.  Musculoskeletal: Normal range of motion.  Skin:    General: Skin is warm and dry.     Findings: No erythema or rash.  Neurological:     General: No focal deficit present.     Mental Status: Alert and oriented to person, place, and time. Mental status is at baseline.     Motor: No weakness.  Psychiatric:        Mood and Affect: Mood normal.        Behavior: Behavior normal.    Assessment/Plan: The patient is scheduled for bilateral breast reduction with liposuction and excision of left cheek lesion with Dr. Marla Roe.  Risks, benefits, and alternatives of procedure discussed, questions answered and consent obtained.    Smoking Status: Non-smoker; Counseling Given?  N/A Last Mammogram: 11/2019; Results: Negative  Caprini Score: 4, moderate; Risk Factors include: 4, moderate, BMI greater than 25, Mirena IUD in place and length of planned surgery. Recommendation for mechanical prophylaxis. Encourage early ambulation.   Pictures obtained: @consult   Post-op Rx sent to pharmacy: Norco, Zofran, Keflex  Patient was provided with the breast reduction and General Surgical Risk consent document and  Pain Medication Agreement prior to their appointment.  They had adequate time to read through the risk consent documents and Pain Medication Agreement. We also discussed them in person together during this preop appointment. All of their questions were answered to their satisfaction.  Recommended calling if they have any further questions.  Risk consent form and Pain Medication Agreement to be scanned into patient's chart.  The risk that can be encountered with breast reduction were discussed and include the following but not limited to these:  Breast asymmetry, fluid accumulation, firmness  of the breast, inability to breast feed, loss of nipple or areola, skin loss, decrease or no nipple sensation, fat necrosis of the breast tissue, bleeding, infection, healing delay.  There are risks of anesthesia, changes to skin sensation and injury to nerves or blood vessels.  The muscle can be temporarily or permanently injured.  You may have an allergic reaction to tape, suture, glue, blood products which can result in skin discoloration, swelling, pain, skin lesions, poor healing.  Any of these can lead to the need for revisonal surgery or stage procedures.  A reduction has potential to interfere with diagnostic procedures.  Nipple or breast piercing can increase risks of infection.  This procedure is best done when the breast is fully developed.  Changes in the breast will continue to occur over time.  Pregnancy can alter the outcomes of previous breast reduction surgery, weight gain and weigh loss can also effect the long term appearance.   The risks that can be encountered with and after liposuction were discussed and include the following but no limited to these:  Asymmetry, fluid accumulation, firmness of the area, fat necrosis with death of fat tissue, bleeding, infection, delayed healing, anesthesia risks, skin sensation changes, injury to structures including nerves, blood vessels, and muscles which may be  temporary or permanent, allergies to tape, suture materials and glues, blood products, topical preparations or injected agents, skin and contour irregularities, skin discoloration and swelling, deep vein thrombosis, cardiac and pulmonary complications, pain, which may persist, persistent pain, recurrence of the lesion, poor healing of the incision, possible need for revisional surgery or staged procedures. Thiere can also be persistent swelling, poor wound healing, rippling or loose skin, worsening of cellulite, swelling, and thermal burn or heat injury from ultrasound with the ultrasound-assisted lipoplasty technique. Any change in weight fluctuations can alter the outcome.    Electronically signed by: Carola Rhine Charon Akamine, PA-C 03/11/2021 11:53 AM

## 2021-03-11 NOTE — H&P (View-Only) (Signed)
Patient ID: Kerry Potter, female    DOB: 05-15-1985, 36 y.o.   MRN: [id number]  Chief Complaint  Patient presents with   Pre-op Exam      ICD-10-CM   1. Symptomatic mammary hypertrophy  N62     2. Neck pain  M54.2     3. Chronic bilateral thoracic back pain  M54.6    G89.29        History of Present Illness: Kerry Potter is a 36 y.o.  female  with a history of macromastia.  She presents for preoperative evaluation for upcoming procedure, Bilateral Breast Reduction with liposuction and excision of mole on face, scheduled for 03/27/2021 with Dr.  Marla Roe  The patient has not had problems with anesthesia. No history of DVT/PE.  No family history of DVT/PE.  No family or personal history of bleeding or clotting disorders.  Patient is not currently taking any blood thinners.  No history of CVA/MI.   Summary of Previous Visit: STN on the right is 30 cm and the left is 30 cm.  Preoperative bra size equals DD cup.  Patient had a mammogram on 9/21 which was negative.  No tobacco use.  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  PMH Significant for: Currently has Mirena IUD in place.  Patient reports she is feeling well, no recent changes to her health.  She is not having any infectious symptoms.  She is excited for breast reduction surgery.  She reports that she would like to be a large C cup if possible.  Past Medical History: Allergies: Allergies  Allergen Reactions   Mobic [Meloxicam] Diarrhea    Current Medications:  Current Outpatient Medications:    cephALEXin (KEFLEX) 500 MG capsule, Take 1 capsule (500 mg total) by mouth 4 (four) times daily for 3 days., Disp: 12 capsule, Rfl: 0   HYDROcodone-acetaminophen (NORCO) 5-325 MG tablet, Take 1 tablet by mouth every 6 (six) hours as needed for up to 5 days for  severe pain., Disp: 20 tablet, Rfl: 0   levonorgestrel (MIRENA) 20 MCG/24HR IUD, 1 each by Intrauterine route once., Disp: , Rfl:    ondansetron (ZOFRAN) 4 MG tablet, Take 1 tablet (4 mg total) by mouth every 8 (eight) hours as needed for nausea or vomiting., Disp: 20 tablet, Rfl: 0   acetaminophen (TYLENOL) 500 MG tablet, Take 2 tablets (1,000 mg total) by mouth every 6 (six) hours as needed for mild pain (for pain scale < 4). (Patient not taking: Reported on 03/11/2021), Disp: 30 tablet, Rfl: 0  Past Medical Problems: Past Medical History:  Diagnosis Date   Acute blood loss anemia 12/10/2015   Medical history non-contributory     Past Surgical History: Past Surgical History:  Procedure Laterality Date   NO PAST SURGERIES     OVARIAN CYST REMOVAL Left 2004    Social History: Social History   Socioeconomic History   Marital status: Married    Spouse name: Not on file   Number of children: 2   Years of education: Not on file   Highest education level: Not on file  Occupational History   Not on file  Tobacco Use   Smoking status: Never   Smokeless tobacco: Never  Vaping Use   Vaping Use: Never used  Substance and Sexual Activity   Alcohol use:  Yes    Comment: social   Drug use: No   Sexual activity: Yes    Birth control/protection: I.U.D.  Other Topics Concern   Not on file  Social History Narrative   Not on file   Social Determinants of Health   Financial Resource Strain: Not on file  Food Insecurity: Not on file  Transportation Needs: Not on file  Physical Activity: Not on file  Stress: Not on file  Social Connections: Not on file  Intimate Partner Violence: Not on file    Family History: Family History  Problem Relation Age of Onset   Ovarian cancer Mother    Hypertension Father    Hypertension Paternal Grandmother    Heart disease Paternal Grandfather    Hypertension Paternal Grandfather    Diabetes Paternal Grandfather     Review of  Systems: Review of Systems  Constitutional: Negative.   Respiratory: Negative.    Cardiovascular: Negative.   Gastrointestinal: Negative.   Neurological: Negative.    Physical Exam: Vital Signs BP (!) 105/59 (BP Location: Left Arm, Patient Position: Sitting, Cuff Size: Large)    Pulse 90    Ht 5\' 6"  (1.676 m)    Wt 177 lb 3.2 oz (80.4 kg)    SpO2 97%    BMI 28.60 kg/m   Physical Exam  Constitutional:      General: Not in acute distress.    Appearance: Normal appearance. Not ill-appearing.  HENT:     Head: Normocephalic and atraumatic.  Eyes:     Pupils: Pupils are equal, round Neck:     Musculoskeletal: Normal range of motion.  Cardiovascular:     Rate and Rhythm: Normal rate    Pulses: Normal pulses.  Pulmonary:     Effort: Pulmonary effort is normal. No respiratory distress.  Musculoskeletal: Normal range of motion.  Skin:    General: Skin is warm and dry.     Findings: No erythema or rash.  Neurological:     General: No focal deficit present.     Mental Status: Alert and oriented to person, place, and time. Mental status is at baseline.     Motor: No weakness.  Psychiatric:        Mood and Affect: Mood normal.        Behavior: Behavior normal.    Assessment/Plan: The patient is scheduled for bilateral breast reduction with liposuction and excision of left cheek lesion with Dr. Marla Roe.  Risks, benefits, and alternatives of procedure discussed, questions answered and consent obtained.    Smoking Status: Non-smoker; Counseling Given?  N/A Last Mammogram: 11/2019; Results: Negative  Caprini Score: 4, moderate; Risk Factors include: 4, moderate, BMI greater than 25, Mirena IUD in place and length of planned surgery. Recommendation for mechanical prophylaxis. Encourage early ambulation.   Pictures obtained: @consult   Post-op Rx sent to pharmacy: Norco, Zofran, Keflex  Patient was provided with the breast reduction and General Surgical Risk consent document and  Pain Medication Agreement prior to their appointment.  They had adequate time to read through the risk consent documents and Pain Medication Agreement. We also discussed them in person together during this preop appointment. All of their questions were answered to their satisfaction.  Recommended calling if they have any further questions.  Risk consent form and Pain Medication Agreement to be scanned into patient's chart.  The risk that can be encountered with breast reduction were discussed and include the following but not limited to these:  Breast asymmetry, fluid accumulation, firmness  of the breast, inability to breast feed, loss of nipple or areola, skin loss, decrease or no nipple sensation, fat necrosis of the breast tissue, bleeding, infection, healing delay.  There are risks of anesthesia, changes to skin sensation and injury to nerves or blood vessels.  The muscle can be temporarily or permanently injured.  You may have an allergic reaction to tape, suture, glue, blood products which can result in skin discoloration, swelling, pain, skin lesions, poor healing.  Any of these can lead to the need for revisonal surgery or stage procedures.  A reduction has potential to interfere with diagnostic procedures.  Nipple or breast piercing can increase risks of infection.  This procedure is best done when the breast is fully developed.  Changes in the breast will continue to occur over time.  Pregnancy can alter the outcomes of previous breast reduction surgery, weight gain and weigh loss can also effect the long term appearance.   The risks that can be encountered with and after liposuction were discussed and include the following but no limited to these:  Asymmetry, fluid accumulation, firmness of the area, fat necrosis with death of fat tissue, bleeding, infection, delayed healing, anesthesia risks, skin sensation changes, injury to structures including nerves, blood vessels, and muscles which may be  temporary or permanent, allergies to tape, suture materials and glues, blood products, topical preparations or injected agents, skin and contour irregularities, skin discoloration and swelling, deep vein thrombosis, cardiac and pulmonary complications, pain, which may persist, persistent pain, recurrence of the lesion, poor healing of the incision, possible need for revisional surgery or staged procedures. Thiere can also be persistent swelling, poor wound healing, rippling or loose skin, worsening of cellulite, swelling, and thermal burn or heat injury from ultrasound with the ultrasound-assisted lipoplasty technique. Any change in weight fluctuations can alter the outcome.    Electronically signed by: Carola Rhine Awesome Jared, PA-C 03/11/2021 11:53 AM

## 2021-03-18 ENCOUNTER — Encounter (HOSPITAL_BASED_OUTPATIENT_CLINIC_OR_DEPARTMENT_OTHER): Payer: Self-pay | Admitting: Plastic Surgery

## 2021-03-18 ENCOUNTER — Other Ambulatory Visit: Payer: Self-pay

## 2021-03-19 ENCOUNTER — Other Ambulatory Visit (HOSPITAL_COMMUNITY): Payer: Self-pay

## 2021-03-26 NOTE — Anesthesia Preprocedure Evaluation (Addendum)
Anesthesia Evaluation  Patient identified by MRN, date of birth, ID band Patient awake    Reviewed: Allergy & Precautions, H&P , NPO status , Patient's Chart, lab work & pertinent test results  Airway Mallampati: I  TM Distance: >3 FB Neck ROM: Full    Dental no notable dental hx. (+) Teeth Intact, Dental Advisory Given   Pulmonary neg pulmonary ROS,    Pulmonary exam normal breath sounds clear to auscultation       Cardiovascular Exercise Tolerance: Good negative cardio ROS Normal cardiovascular exam Rhythm:Regular Rate:Normal     Neuro/Psych negative neurological ROS  negative psych ROS   GI/Hepatic negative GI ROS, Neg liver ROS,   Endo/Other  negative endocrine ROS  Renal/GU negative Renal ROS  negative genitourinary   Musculoskeletal negative musculoskeletal ROS (+)   Abdominal   Peds negative pediatric ROS (+)  Hematology negative hematology ROS (+)   Anesthesia Other Findings   Reproductive/Obstetrics negative OB ROS                            Anesthesia Physical Anesthesia Plan  ASA: 1  Anesthesia Plan: General   Post-op Pain Management: Ofirmev IV (intra-op), Toradol IV (intra-op) and Regional block   Induction: Intravenous  PONV Risk Score and Plan: 3 and Ondansetron, Dexamethasone, Midazolam and Treatment may vary due to age or medical condition  Airway Management Planned: Oral ETT and LMA  Additional Equipment: None  Intra-op Plan:   Post-operative Plan: Extubation in OR  Informed Consent: I have reviewed the patients History and Physical, chart, labs and discussed the procedure including the risks, benefits and alternatives for the proposed anesthesia with the patient or authorized representative who has indicated his/her understanding and acceptance.       Plan Discussed with: Anesthesiologist and CRNA  Anesthesia Plan Comments: (  )        Anesthesia Quick Evaluation

## 2021-03-27 ENCOUNTER — Observation Stay (HOSPITAL_BASED_OUTPATIENT_CLINIC_OR_DEPARTMENT_OTHER)
Admission: RE | Admit: 2021-03-27 | Discharge: 2021-03-28 | Disposition: A | Discharge status: Home / Self Care | Payer: 59 | Attending: Plastic Surgery | Admitting: Plastic Surgery

## 2021-03-27 ENCOUNTER — Encounter (HOSPITAL_BASED_OUTPATIENT_CLINIC_OR_DEPARTMENT_OTHER): Payer: Self-pay | Admitting: Plastic Surgery

## 2021-03-27 ENCOUNTER — Encounter (HOSPITAL_BASED_OUTPATIENT_CLINIC_OR_DEPARTMENT_OTHER)
Admission: RE | Disposition: A | Discharge status: Home / Self Care | Payer: Self-pay | Source: Home / Self Care | Attending: Plastic Surgery

## 2021-03-27 ENCOUNTER — Ambulatory Visit (HOSPITAL_BASED_OUTPATIENT_CLINIC_OR_DEPARTMENT_OTHER): Payer: 59 | Admitting: Anesthesiology

## 2021-03-27 ENCOUNTER — Other Ambulatory Visit: Payer: Self-pay

## 2021-03-27 DIAGNOSIS — G8929 Other chronic pain: Secondary | ICD-10-CM | POA: Diagnosis not present

## 2021-03-27 DIAGNOSIS — L988 Other specified disorders of the skin and subcutaneous tissue: Secondary | ICD-10-CM | POA: Diagnosis not present

## 2021-03-27 DIAGNOSIS — D2239 Melanocytic nevi of other parts of face: Secondary | ICD-10-CM | POA: Diagnosis not present

## 2021-03-27 DIAGNOSIS — M546 Pain in thoracic spine: Secondary | ICD-10-CM | POA: Diagnosis not present

## 2021-03-27 DIAGNOSIS — M542 Cervicalgia: Secondary | ICD-10-CM | POA: Diagnosis not present

## 2021-03-27 DIAGNOSIS — N62 Hypertrophy of breast: Secondary | ICD-10-CM | POA: Diagnosis not present

## 2021-03-27 HISTORY — PX: BREAST REDUCTION SURGERY: SHX8

## 2021-03-27 HISTORY — PX: LIPOMA EXCISION: SHX5283

## 2021-03-27 LAB — POCT PREGNANCY, URINE: Preg Test, Ur: NEGATIVE

## 2021-03-27 SURGERY — BREAST REDUCTION WITH LIPOSUCTION
Anesthesia: General | Site: Face

## 2021-03-27 MED ORDER — LIDOCAINE HCL (PF) 1 % IJ SOLN
INTRAMUSCULAR | Status: AC
  Filled 2021-03-27: qty 30

## 2021-03-27 MED ORDER — NITROGLYCERIN 2 % TD OINT
TOPICAL_OINTMENT | TRANSDERMAL | Status: AC
  Filled 2021-03-27: qty 30

## 2021-03-27 MED ORDER — FENTANYL CITRATE (PF) 100 MCG/2ML IJ SOLN
INTRAMUSCULAR | Status: DC | PRN
  Administered 2021-03-27 (×2): 50 ug via INTRAVENOUS
  Administered 2021-03-27: 100 ug via INTRAVENOUS
  Administered 2021-03-27: 50 ug via INTRAVENOUS

## 2021-03-27 MED ORDER — LIDOCAINE-EPINEPHRINE 1 %-1:100000 IJ SOLN
INTRAMUSCULAR | Status: AC
  Filled 2021-03-27: qty 1

## 2021-03-27 MED ORDER — PHENYLEPHRINE 40 MCG/ML (10ML) SYRINGE FOR IV PUSH (FOR BLOOD PRESSURE SUPPORT)
PREFILLED_SYRINGE | INTRAVENOUS | Status: AC
  Filled 2021-03-27: qty 10

## 2021-03-27 MED ORDER — MEPERIDINE HCL 25 MG/ML IJ SOLN: 6.2500 mg | INTRAMUSCULAR | Status: DC | PRN

## 2021-03-27 MED ORDER — ROCURONIUM BROMIDE 100 MG/10ML IV SOLN
INTRAVENOUS | Status: DC | PRN
  Administered 2021-03-27: 60 mg via INTRAVENOUS

## 2021-03-27 MED ORDER — SODIUM CHLORIDE (PF) 0.9 % IJ SOLN
INTRAMUSCULAR | Status: DC | PRN
  Administered 2021-03-27: 350 mL via INTRAVENOUS

## 2021-03-27 MED ORDER — LIDOCAINE HCL (CARDIAC) PF 100 MG/5ML IV SOSY
PREFILLED_SYRINGE | INTRAVENOUS | Status: DC | PRN
  Administered 2021-03-27: 30 mg via INTRAVENOUS

## 2021-03-27 MED ORDER — SODIUM CHLORIDE 0.9% FLUSH: 3.0000 mL | Freq: Two times a day (BID) | INTRAVENOUS | Status: DC

## 2021-03-27 MED ORDER — ACETAMINOPHEN 500 MG PO TABS
ORAL_TABLET | ORAL | Status: AC
  Filled 2021-03-27: qty 2

## 2021-03-27 MED ORDER — ROCURONIUM BROMIDE 10 MG/ML (PF) SYRINGE
PREFILLED_SYRINGE | INTRAVENOUS | Status: AC
  Filled 2021-03-27: qty 10

## 2021-03-27 MED ORDER — FENTANYL CITRATE (PF) 100 MCG/2ML IJ SOLN
INTRAMUSCULAR | Status: AC
  Filled 2021-03-27: qty 2

## 2021-03-27 MED ORDER — NITROGLYCERIN 2 % TD OINT
0.5000 [in_us] | TOPICAL_OINTMENT | Freq: Three times a day (TID) | TRANSDERMAL | Status: AC
  Administered 2021-03-27 (×2): 0.5 [in_us] via TOPICAL

## 2021-03-27 MED ORDER — ACETAMINOPHEN 325 MG RE SUPP: 650.0000 mg | RECTAL | Status: DC | PRN

## 2021-03-27 MED ORDER — MORPHINE SULFATE (PF) 4 MG/ML IV SOLN: 1.0000 mg | INTRAVENOUS | Status: DC | PRN

## 2021-03-27 MED ORDER — ACETAMINOPHEN 325 MG RE SUPP: 650.0000 mg | Freq: Four times a day (QID) | RECTAL | Status: DC | PRN

## 2021-03-27 MED ORDER — FENTANYL CITRATE (PF) 100 MCG/2ML IJ SOLN
25.0000 ug | INTRAMUSCULAR | Status: DC | PRN
  Administered 2021-03-27 (×2): 50 ug via INTRAVENOUS

## 2021-03-27 MED ORDER — DEXAMETHASONE SODIUM PHOSPHATE 10 MG/ML IJ SOLN
INTRAMUSCULAR | Status: AC
  Filled 2021-03-27: qty 1

## 2021-03-27 MED ORDER — ONDANSETRON 4 MG PO TBDP: 4.0000 mg | ORAL_TABLET | Freq: Four times a day (QID) | ORAL | Status: DC | PRN

## 2021-03-27 MED ORDER — ONDANSETRON HCL 4 MG/2ML IJ SOLN: 4.0000 mg | Freq: Four times a day (QID) | INTRAMUSCULAR | Status: DC | PRN

## 2021-03-27 MED ORDER — POLYETHYLENE GLYCOL 3350 17 G PO PACK: 17.0000 g | PACK | Freq: Every day | ORAL | Status: DC | PRN

## 2021-03-27 MED ORDER — CELECOXIB 200 MG PO CAPS
ORAL_CAPSULE | ORAL | Status: AC
  Filled 2021-03-27: qty 1

## 2021-03-27 MED ORDER — MIDAZOLAM HCL 2 MG/2ML IJ SOLN
INTRAMUSCULAR | Status: AC
  Filled 2021-03-27: qty 2

## 2021-03-27 MED ORDER — PROPOFOL 10 MG/ML IV BOLUS
INTRAVENOUS | Status: DC | PRN
Start: 2021-03-27 — End: 2021-03-27
  Administered 2021-03-27: 150 mg via INTRAVENOUS

## 2021-03-27 MED ORDER — GABAPENTIN 100 MG PO CAPS
100.0000 mg | ORAL_CAPSULE | Freq: Once | ORAL | Status: AC
  Administered 2021-03-27: 100 mg via ORAL

## 2021-03-27 MED ORDER — ACETAMINOPHEN 325 MG PO TABS: 650.0000 mg | ORAL_TABLET | Freq: Four times a day (QID) | ORAL | Status: DC | PRN

## 2021-03-27 MED ORDER — DEXAMETHASONE SODIUM PHOSPHATE 4 MG/ML IJ SOLN
INTRAMUSCULAR | Status: DC | PRN
  Administered 2021-03-27: 5 mg via INTRAVENOUS

## 2021-03-27 MED ORDER — SCOPOLAMINE 1 MG/3DAYS TD PT72
1.0000 | MEDICATED_PATCH | TRANSDERMAL | Status: DC
  Administered 2021-03-27: 1.5 mg via TRANSDERMAL

## 2021-03-27 MED ORDER — SCOPOLAMINE 1 MG/3DAYS TD PT72
MEDICATED_PATCH | TRANSDERMAL | Status: AC
  Filled 2021-03-27: qty 1

## 2021-03-27 MED ORDER — ACETAMINOPHEN 325 MG PO TABS: 650.0000 mg | ORAL_TABLET | ORAL | Status: DC | PRN

## 2021-03-27 MED ORDER — FENTANYL CITRATE (PF) 100 MCG/2ML IJ SOLN: 25.0000 ug | INTRAMUSCULAR | Status: DC | PRN

## 2021-03-27 MED ORDER — OXYCODONE HCL 5 MG PO TABS
ORAL_TABLET | ORAL | Status: AC
  Filled 2021-03-27: qty 1

## 2021-03-27 MED ORDER — IBUPROFEN 600 MG PO TABS
600.0000 mg | ORAL_TABLET | Freq: Four times a day (QID) | ORAL | Status: DC | PRN
  Administered 2021-03-27: 600 mg via ORAL
  Filled 2021-03-27: qty 1

## 2021-03-27 MED ORDER — KCL IN DEXTROSE-NACL 20-5-0.45 MEQ/L-%-% IV SOLN
INTRAVENOUS | Status: DC
  Filled 2021-03-27: qty 1000

## 2021-03-27 MED ORDER — LIDOCAINE-EPINEPHRINE 1 %-1:100000 IJ SOLN
INTRAMUSCULAR | Status: DC | PRN
  Administered 2021-03-27: 25 mL
  Administered 2021-03-27: 1 mL

## 2021-03-27 MED ORDER — CELECOXIB 200 MG PO CAPS
200.0000 mg | ORAL_CAPSULE | Freq: Once | ORAL | Status: AC
  Administered 2021-03-27: 200 mg via ORAL

## 2021-03-27 MED ORDER — ACETAMINOPHEN 325 MG PO TABS: 325.0000 mg | ORAL_TABLET | ORAL | Status: DC | PRN

## 2021-03-27 MED ORDER — METHOCARBAMOL 500 MG PO TABS
500.0000 mg | ORAL_TABLET | Freq: Four times a day (QID) | ORAL | Status: DC | PRN
  Administered 2021-03-27 – 2021-03-28 (×2): 500 mg via ORAL
  Filled 2021-03-27 (×2): qty 1

## 2021-03-27 MED ORDER — CHLORHEXIDINE GLUCONATE CLOTH 2 % EX PADS: 6.0000 | MEDICATED_PAD | Freq: Once | CUTANEOUS | Status: DC

## 2021-03-27 MED ORDER — ACETAMINOPHEN 160 MG/5ML PO SOLN: 325.0000 mg | ORAL | Status: DC | PRN

## 2021-03-27 MED ORDER — DIPHENHYDRAMINE HCL 50 MG/ML IJ SOLN: 25.0000 mg | Freq: Four times a day (QID) | INTRAMUSCULAR | Status: DC | PRN

## 2021-03-27 MED ORDER — EPINEPHRINE PF 1 MG/ML IJ SOLN
INTRAMUSCULAR | Status: AC
  Filled 2021-03-27: qty 1

## 2021-03-27 MED ORDER — ONDANSETRON HCL 4 MG/2ML IJ SOLN
4.0000 mg | Freq: Once | INTRAMUSCULAR | Status: AC | PRN
  Administered 2021-03-27: 4 mg via INTRAVENOUS

## 2021-03-27 MED ORDER — BUPIVACAINE HCL (PF) 0.25 % IJ SOLN
INTRAMUSCULAR | Status: AC
  Filled 2021-03-27: qty 30

## 2021-03-27 MED ORDER — OXYCODONE HCL 5 MG PO TABS
5.0000 mg | ORAL_TABLET | ORAL | Status: DC | PRN
  Administered 2021-03-27 – 2021-03-28 (×2): 5 mg via ORAL
  Filled 2021-03-27 (×2): qty 1

## 2021-03-27 MED ORDER — CEFAZOLIN SODIUM-DEXTROSE 2-4 GM/100ML-% IV SOLN
2.0000 g | INTRAVENOUS | Status: AC
  Administered 2021-03-27: 2 g via INTRAVENOUS

## 2021-03-27 MED ORDER — MIDAZOLAM HCL 5 MG/5ML IJ SOLN
INTRAMUSCULAR | Status: DC | PRN
  Administered 2021-03-27: 2 mg via INTRAVENOUS

## 2021-03-27 MED ORDER — OXYCODONE HCL 5 MG/5ML PO SOLN: 5.0000 mg | Freq: Once | ORAL | Status: AC | PRN

## 2021-03-27 MED ORDER — SODIUM CHLORIDE 0.9% FLUSH: 3.0000 mL | INTRAVENOUS | Status: DC | PRN

## 2021-03-27 MED ORDER — ATROPINE SULFATE 0.4 MG/ML IV SOLN
INTRAVENOUS | Status: AC
  Filled 2021-03-27: qty 1

## 2021-03-27 MED ORDER — DIPHENHYDRAMINE HCL 25 MG PO CAPS: 25.0000 mg | ORAL_CAPSULE | Freq: Four times a day (QID) | ORAL | Status: DC | PRN

## 2021-03-27 MED ORDER — HYDROCODONE-ACETAMINOPHEN 5-325 MG PO TABS: 1.0000 | ORAL_TABLET | ORAL | Status: DC | PRN

## 2021-03-27 MED ORDER — LACTATED RINGERS IV SOLN: INTRAVENOUS | Status: DC

## 2021-03-27 MED ORDER — LIDOCAINE HCL 1 % IJ SOLN
INTRAMUSCULAR | Status: DC | PRN
  Administered 2021-03-27: 50 mL

## 2021-03-27 MED ORDER — ONDANSETRON HCL 4 MG/2ML IJ SOLN
INTRAMUSCULAR | Status: AC
  Filled 2021-03-27: qty 2

## 2021-03-27 MED ORDER — BUPIVACAINE HCL (PF) 0.25 % IJ SOLN
INTRAMUSCULAR | Status: DC | PRN
  Administered 2021-03-27: 25 mL

## 2021-03-27 MED ORDER — EPHEDRINE 5 MG/ML INJ
INTRAVENOUS | Status: AC
  Filled 2021-03-27: qty 5

## 2021-03-27 MED ORDER — SENNA 8.6 MG PO TABS
1.0000 | ORAL_TABLET | Freq: Two times a day (BID) | ORAL | Status: DC | PRN
  Administered 2021-03-27: 8.6 mg via ORAL
  Filled 2021-03-27: qty 1

## 2021-03-27 MED ORDER — NITROGLYCERIN 2 % TD OINT
TOPICAL_OINTMENT | TRANSDERMAL | Status: DC | PRN
  Administered 2021-03-27: 0.5 [in_us] via TOPICAL

## 2021-03-27 MED ORDER — LIDOCAINE 2% (20 MG/ML) 5 ML SYRINGE
INTRAMUSCULAR | Status: AC
  Filled 2021-03-27: qty 5

## 2021-03-27 MED ORDER — SODIUM CHLORIDE 0.9 % IV SOLN: 250.0000 mL | INTRAVENOUS | Status: DC | PRN

## 2021-03-27 MED ORDER — CEFAZOLIN SODIUM-DEXTROSE 2-4 GM/100ML-% IV SOLN
INTRAVENOUS | Status: AC
  Filled 2021-03-27: qty 100

## 2021-03-27 MED ORDER — ONDANSETRON HCL 4 MG/2ML IJ SOLN
INTRAMUSCULAR | Status: DC | PRN
  Administered 2021-03-27: 4 mg via INTRAVENOUS

## 2021-03-27 MED ORDER — SUCCINYLCHOLINE CHLORIDE 200 MG/10ML IV SOSY
PREFILLED_SYRINGE | INTRAVENOUS | Status: AC
  Filled 2021-03-27: qty 10

## 2021-03-27 MED ORDER — ACETAMINOPHEN 500 MG PO TABS
1000.0000 mg | ORAL_TABLET | Freq: Once | ORAL | Status: AC
  Administered 2021-03-27: 1000 mg via ORAL

## 2021-03-27 MED ORDER — GABAPENTIN 100 MG PO CAPS
ORAL_CAPSULE | ORAL | Status: AC
  Filled 2021-03-27: qty 1

## 2021-03-27 MED ORDER — EPINEPHRINE PF 1 MG/ML IJ SOLN
INTRAMUSCULAR | Status: DC | PRN
  Administered 2021-03-27: 1 mg

## 2021-03-27 MED ORDER — OXYCODONE HCL 5 MG PO TABS
5.0000 mg | ORAL_TABLET | Freq: Once | ORAL | Status: AC | PRN
  Administered 2021-03-27: 5 mg via ORAL

## 2021-03-27 MED ORDER — AMISULPRIDE (ANTIEMETIC) 5 MG/2ML IV SOLN
10.0000 mg | Freq: Once | INTRAVENOUS | Status: AC
  Administered 2021-03-27: 10 mg via INTRAVENOUS

## 2021-03-27 MED ORDER — AMISULPRIDE (ANTIEMETIC) 5 MG/2ML IV SOLN
INTRAVENOUS | Status: AC
  Filled 2021-03-27: qty 4

## 2021-03-27 SURGICAL SUPPLY — 63 items
BAG DECANTER FOR FLEXI CONT (MISCELLANEOUS) ×3 IMPLANT
BINDER BREAST XLRG (GAUZE/BANDAGES/DRESSINGS) ×1 IMPLANT
BIOPATCH RED 1 DISK 7.0 (GAUZE/BANDAGES/DRESSINGS) IMPLANT
BLADE HEX COATED 2.75 (ELECTRODE) ×3 IMPLANT
BLADE KNIFE PERSONA 10 (BLADE) ×6 IMPLANT
BLADE SURG 15 STRL LF DISP TIS (BLADE) IMPLANT
BLADE SURG 15 STRL SS (BLADE)
CANISTER SUCT 1200ML W/VALVE (MISCELLANEOUS) ×3 IMPLANT
COVER BACK TABLE 60X90IN (DRAPES) ×3 IMPLANT
COVER MAYO STAND STRL (DRAPES) ×3 IMPLANT
DECANTER SPIKE VIAL GLASS SM (MISCELLANEOUS) IMPLANT
DERMABOND ADVANCED (GAUZE/BANDAGES/DRESSINGS) ×2
DERMABOND ADVANCED .7 DNX12 (GAUZE/BANDAGES/DRESSINGS) ×4 IMPLANT
DRAIN CHANNEL 19F RND (DRAIN) IMPLANT
DRAPE LAPAROSCOPIC ABDOMINAL (DRAPES) ×3 IMPLANT
DRSG OPSITE POSTOP 4X6 (GAUZE/BANDAGES/DRESSINGS) IMPLANT
DRSG PAD ABDOMINAL 8X10 ST (GAUZE/BANDAGES/DRESSINGS) ×6 IMPLANT
ELECT BLADE 4.0 EZ CLEAN MEGAD (MISCELLANEOUS) ×3
ELECT REM PT RETURN 9FT ADLT (ELECTROSURGICAL) ×3
ELECTRODE BLDE 4.0 EZ CLN MEGD (MISCELLANEOUS) ×2 IMPLANT
ELECTRODE REM PT RTRN 9FT ADLT (ELECTROSURGICAL) ×2 IMPLANT
EVACUATOR SILICONE 100CC (DRAIN) IMPLANT
GLOVE SRG 8 PF TXTR STRL LF DI (GLOVE) IMPLANT
GLOVE SURG ENC MOIS LTX SZ6.5 (GLOVE) ×9 IMPLANT
GLOVE SURG ENC MOIS LTX SZ7.5 (GLOVE) ×4 IMPLANT
GLOVE SURG UNDER POLY LF SZ7 (GLOVE) ×3 IMPLANT
GLOVE SURG UNDER POLY LF SZ7.5 (GLOVE) ×1 IMPLANT
GLOVE SURG UNDER POLY LF SZ8 (GLOVE) ×1
GOWN STRL REUS W/ TWL LRG LVL3 (GOWN DISPOSABLE) ×2 IMPLANT
GOWN STRL REUS W/TWL LRG LVL3 (GOWN DISPOSABLE) ×1
NDL FILTER BLUNT 18X1 1/2 (NEEDLE) IMPLANT
NDL HYPO 25X1 1.5 SAFETY (NEEDLE) ×2 IMPLANT
NEEDLE FILTER BLUNT 18X 1/2SAF (NEEDLE)
NEEDLE FILTER BLUNT 18X1 1/2 (NEEDLE) IMPLANT
NEEDLE HYPO 25X1 1.5 SAFETY (NEEDLE) ×3 IMPLANT
NS IRRIG 1000ML POUR BTL (IV SOLUTION) ×3 IMPLANT
PACK BASIN DAY SURGERY FS (CUSTOM PROCEDURE TRAY) ×3 IMPLANT
PAD ALCOHOL SWAB (MISCELLANEOUS) IMPLANT
PAD FOAM SILICONE BACKED (GAUZE/BANDAGES/DRESSINGS) IMPLANT
PENCIL SMOKE EVACUATOR (MISCELLANEOUS) ×3 IMPLANT
PIN SAFETY STERILE (MISCELLANEOUS) IMPLANT
SLEEVE SCD COMPRESS KNEE MED (STOCKING) ×3 IMPLANT
SPONGE T-LAP 18X18 ~~LOC~~+RFID (SPONGE) ×6 IMPLANT
STRIP SUTURE WOUND CLOSURE 1/2 (MISCELLANEOUS) ×6 IMPLANT
SUT MNCRL AB 4-0 PS2 18 (SUTURE) ×10 IMPLANT
SUT MON AB 3-0 SH 27 (SUTURE) ×5
SUT MON AB 3-0 SH27 (SUTURE) ×4 IMPLANT
SUT MON AB 5-0 P3 18 (SUTURE) ×1 IMPLANT
SUT MON AB 5-0 PS2 18 (SUTURE) ×6 IMPLANT
SUT PDS 3-0 CT2 (SUTURE) ×18
SUT PDS II 3-0 CT2 27 ABS (SUTURE) ×12 IMPLANT
SUT SILK 3 0 PS 1 (SUTURE) IMPLANT
SYR 50ML LL SCALE MARK (SYRINGE) IMPLANT
SYR BULB IRRIG 60ML STRL (SYRINGE) ×3 IMPLANT
SYR CONTROL 10ML LL (SYRINGE) ×3 IMPLANT
TAPE MEASURE VINYL STERILE (MISCELLANEOUS) IMPLANT
TOWEL GREEN STERILE FF (TOWEL DISPOSABLE) ×6 IMPLANT
TRAY DSU PREP LF (CUSTOM PROCEDURE TRAY) ×4 IMPLANT
TUBE CONNECTING 20X1/4 (TUBING) ×3 IMPLANT
TUBING INFILTRATION IT-10001 (TUBING) IMPLANT
TUBING SET GRADUATE ASPIR 12FT (MISCELLANEOUS) IMPLANT
UNDERPAD 30X36 HEAVY ABSORB (UNDERPADS AND DIAPERS) ×6 IMPLANT
YANKAUER SUCT BULB TIP NO VENT (SUCTIONS) ×3 IMPLANT

## 2021-03-27 NOTE — Anesthesia Procedure Notes (Signed)

## 2021-03-27 NOTE — Interval H&P Note (Signed)
History and Physical Interval Note:  03/27/2021 7:05 AM  Kerry Potter  has presented today for surgery, with the diagnosis of mammary hypertrophy.  The various methods of treatment have been discussed with the patient and family. After consideration of risks, benefits and other options for treatment, the patient has consented to  Procedure(s) with comments: BREAST REDUCTION WITH LIPOSUCTION (Bilateral) - 3 hours excision of mole on face (N/A) as a surgical intervention.  The patient's history has been reviewed, patient examined, no change in status, stable for surgery.  I have reviewed the patient's chart and labs.  Questions were answered to the patient's satisfaction.     Loel Lofty Mykel Mohl

## 2021-03-27 NOTE — Anesthesia Postprocedure Evaluation (Signed)
Anesthesia Post Note  Patient: Kerry Potter  Procedure(s) Performed: BREAST REDUCTION WITH LIPOSUCTION (Bilateral: Breast) excision of mole on face (Face)     Patient location during evaluation: PACU Anesthesia Type: General Level of consciousness: awake and alert Pain management: pain level controlled Vital Signs Assessment: post-procedure vital signs reviewed and stable Respiratory status: spontaneous breathing, nonlabored ventilation, respiratory function stable and patient connected to nasal cannula oxygen Cardiovascular status: blood pressure returned to baseline and stable Postop Assessment: no apparent nausea or vomiting Anesthetic complications: no   No notable events documented.  Last Vitals:  Vitals:   03/27/21 0957 03/27/21 1000  BP: 130/71   Pulse: (!) 104 92  Resp: 15 16  Temp: 36.9 C   SpO2: 99% 99%    Last Pain:  Vitals:   03/27/21 1000  TempSrc:   PainSc: Asleep                 Laurelle Skiver

## 2021-03-27 NOTE — Transfer of Care (Signed)
Immediate Anesthesia Transfer of Care Note  Patient: Kerry Potter  Procedure(s) Performed: BREAST REDUCTION WITH LIPOSUCTION (Bilateral: Breast) excision of mole on face (Face)  Patient Location: PACU  Anesthesia Type:General  Level of Consciousness: awake, alert , oriented, drowsy and patient cooperative  Airway & Oxygen Therapy: Patient Spontanous Breathing and Patient connected to face mask oxygen  Post-op Assessment: Report given to RN and Post -op Vital signs reviewed and stable  Post vital signs: Reviewed and stable  Last Vitals:  Vitals Value Taken Time  BP    Temp    Pulse    Resp    SpO2      Last Pain:  Vitals:   03/27/21 0633  TempSrc: Oral  PainSc: 0-No pain         Complications: No notable events documented.

## 2021-03-27 NOTE — Discharge Instructions (Addendum)
INSTRUCTIONS FOR AFTER BREAST SURGERY   You will likely have some questions about what to expect following your operation.  The following information will help you and your family understand what to expect when you are discharged from the hospital.  Following these guidelines will help ensure a smooth recovery and reduce risks of complications.  Postoperative instructions include information on: diet, wound care, medications and physical activity.  AFTER SURGERY Expect to go home after the procedure.  In some cases, you may need to spend one night in the hospital for observation.  DIET Breast surgery does not require a specific diet.  However, the healthier you eat the better your body can start healing. It is important to increasing your protein intake.  This means limiting the foods with sugar and carbohydrates.  Focus on vegetables and some meat.  If you have any liposuction during your procedure be sure to drink water.  If your urine is bright yellow, then it is concentrated, and you need to drink more water.  As a general rule after surgery, you should have 8 ounces of water every hour while awake.  If you find you are persistently nauseated or unable to take in liquids let us know.  NO TOBACCO USE or EXPOSURE.  This will slow your healing process and increase the risk of a wound.  WOUND CARE Leave the ACE wrap or binder on for 3 days . Use fragrance free soap.   After 3 days you can remove the ACE wrap or binder to shower. Once dry apply ACE wrap, binder or sports bra.  Use a mild soap like Dial, Dove and [place]. You may have Topifoam or Lipofoam on.  It is soft and spongy and helps keep you from getting creases if you have liposuction.  This can be removed before the shower and then replaced.  If you need more it is available on [employer] (Lipofoam). If you have steri-strips / tape directly attached to your skin leave them in place. It is OK to get these wet.   No baths, pools or hot tubs for four  weeks. We close your incision to leave the smallest and best-looking scar. No ointment or creams on your incisions until given the go ahead.  Especially not Neosporin (Too many skin reactions with this one).  A few weeks after surgery you can use Mederma and start massaging the scar. We ask you to wear your binder or sports bra for the first 6 weeks around the clock, including while sleeping. This provides added comfort and helps reduce the fluid accumulation at the surgery site.  ACTIVITY No heavy lifting until cleared by the doctor.  This usually means no more than a half-gallon of milk.  It is OK to walk and climb stairs. In fact, moving your legs is very important to decrease your risk of a blood clot.  It will also help keep you from getting deconditioned.  Every 1 to 2 hours get up and walk for 5 minutes. This will help with a quicker recovery back to normal.  Let pain be your guide so you don't do too much.  This is not the time for spring cleaning and don't plan on taking care of anyone else.  This time is for you to recover,  You will be more comfortable if you sleep and rest with your head elevated either with a few pillows under you or in a recliner.  No stomach sleeping for a three months.  WORK Everyone  returns to work at different times. As a rough guide, most people take at least 1 - 2 weeks off prior to returning to work. If you need documentation for your job, bring the forms to your postoperative follow up visit.  DRIVING Arrange for someone to bring you home from the hospital.  You may be able to drive a few days after surgery but not while taking any narcotics or valium.  BOWEL MOVEMENTS Constipation can occur after anesthesia and while taking pain medication.  It is important to stay ahead for your comfort.  We recommend taking Milk of Magnesia (2 tablespoons; twice a day) while taking the pain pills.  MEDICATIONS You may be prescribed should start after surgery At your  preoperative visit for you history and physical you may have been given the following medications: An antibiotic: Start this medication when you get home and take according to the instructions on the bottle. Zofran 4 mg:  This is to treat nausea and vomiting.  You can take this every 6 hours as needed and only if needed. Valium 2 mg: This is for muscle tightness if you have an implant or expander. This will help relax your muscle which also helps with pain control.  This can be taken every 12 hours as needed. Don't drive after taking this medication. Norco (hydrocodone/acetaminophen) 5/325 mg:  This is only to be used after you have taken the motrin or the tylenol. Every 8 hours as needed. Apply 1/2 inch Nitro-Bid to Left nipple every 8 hours for 2 days.- Last given at 9:45 AM on 03/27/21   Over the counter Medication to take: Ibuprofen (Motrin) 600 mg:  Take this every 6 hours.  If you have additional pain then take 500 mg of the tylenol every 8 hours.  Only take the Norco after you have tried these two. Miralax or stool softener of choice: Take this according to the bottle if you take the [place] Call your surgeon's office if any of the following occur: Fever 101 degrees F or greater Excessive bleeding or fluid from the incision site. Pain that increases over time without aid from the medications Redness, warmth, or pus draining from incision sites Persistent nausea or inability to take in liquids Severe misshapen area that underwent the operation.  Here are some resources:  Plastic surgery website: https://www.plasticsurgery.org/for-medical-professionals/education-and-resources/publications/breast-reconstruction-magazine Breast Reconstruction Awareness Campaign:  [employer] Plastic surgery Implant information:  https://www.plasticsurgery.org/patient-safety/breast-implant-safety   Post Anesthesia Home Care Instructions  Activity: Get plenty of rest for  the remainder of the day. A responsible individual must stay with you for 24 hours following the procedure.  For the next 24 hours, DO NOT: -Drive a car -Paediatric nurse -Drink alcoholic beverages -Take any medication unless instructed by your physician -Make any legal decisions or sign important papers.  Meals: Start with liquid foods such as gelatin or soup. Progress to regular foods as tolerated. Avoid greasy, spicy, heavy foods. If nausea and/or vomiting occur, drink only clear liquids until the nausea and/or vomiting subsides. Call your physician if vomiting continues.  Special Instructions/Symptoms: Your throat may feel dry or sore from the anesthesia or the breathing tube placed in your throat during surgery. If this causes discomfort, gargle with warm salt water. The discomfort should disappear within 24 hours.  If you had a scopolamine patch placed behind your ear for the management of post- operative nausea and/or vomiting:  1. The medication in the patch is effective for 72 hours, after which it should  be removed.  Wrap patch in a tissue and discard in the trash. Wash hands thoroughly with soap and water. 2. You may remove the patch earlier than 72 hours if you experience unpleasant side effects which may include dry mouth, dizziness or visual disturbances. 3. Avoid touching the patch. Wash your hands with soap and water after contact with the patch.  *May have Tylenol and Ibuprofen today after 1pm 03/27/2021  Oxycodone given at 11:45am today

## 2021-03-27 NOTE — Op Note (Addendum)
Breast Reduction Op note:    DATE OF PROCEDURE: 03/27/2021  LOCATION: [place]  SURGEON: Lyndee Leo Sanger Kairy Folsom, DO  ASSISTANT: Roetta Sessions, PA  PREOPERATIVE DIAGNOSIS 1. Macromastia 2. Neck Pain 3. Back Pain 4. Changing skin lesion of left cheek  POSTOPERATIVE DIAGNOSIS:  same Same as preoperatve diagnosis  PROCEDURES 1. Bilateral breast reduction.  Right reduction 562 g, Left reduction 700 g 2. Excision of changing skin lesion of left cheek. 6 mm  COMPLICATIONS: None.  DRAINS: none  INDICATIONS FOR PROCEDURE Kerry Potter is a 36 y.o. year-old female born on 1985/11/29,with a history of symptomatic macromastia with concominant back pain, neck pain, shoulder grooving from her bra.   MRN: [id number]  CONSENT Informed consent was obtained directly from the patient. The risks, benefits and alternatives were fully discussed. Specific risks including but not limited to bleeding, infection, hematoma, seroma, scarring, pain, nipple necrosis, asymmetry, poor cosmetic results, and need for further surgery were discussed. The patient had ample opportunity to have her questions answered to her satisfaction.  DESCRIPTION OF PROCEDURE  Patient was brought into the operating room and placed in a supine position.  SCDs were placed and appropriate padding was performed.  Antibiotics were given. The patient underwent general anesthesia and the chest was prepped and draped in a sterile fashion.  A timeout was performed and all information was confirmed to be correct. Tumescent was placed in the lateral breast and liposuction was done for improved contour on the lateral aspect of each breast.  Left cheek: The face was prepped and draped.  Local was injected.  The #15 blade was used to excise the lesion in an ellipse.  The 6 mm lesion was excised and sent to pathology.  The skin was closed with the 5-0 Monocryl.    Right side: Preoperative markings were confirmed.   Incision lines were injected with local with epinephrine.  After waiting for vasoconstriction, the marked lines were incised.  A Wise-pattern superomedial breast reduction was performed by de-epithelializing the pedicle, using bovie to create the superomedial pedicle, and removing breast tissue from the superior, lateral, and inferior portions of the breast.  Care was taken to not undermine the breast pedicle. Hemostasis was achieved.  The nipple was gently rotated into position and the soft tissue closed with 4-0 Monocryl.   The pocket was irrigated and hemostasis confirmed.  The deep tissues were approximated with 2-0 PDS and 3-0 Monocryl sutures and the skin was closed with deep dermal and subcuticular 4-0 Monocryl sutures.  The nipple and skin flaps had good capillary refill at the end of the procedure.    Left side: Preoperative markings were confirmed.  Incision lines were injected with local with epinephrine.  After waiting for vasoconstriction, the marked lines were incised.  A Wise-pattern superomedial breast reduction was performed by de-epithelializing the pedicle, using bovie to create the superomedial pedicle, and removing breast tissue from the superior, lateral, and inferior portions of the breast.  Care was taken to not undermine the breast pedicle. Hemostasis was achieved.  The nipple was gently rotated into position and the soft tissue was closed with 4-0 Monocryl.  The patient was sat upright and size and shape symmetry was confirmed.  The pocket was irrigated and hemostasis confirmed.  The deep tissues were approximated with 2-0 PDS and 3-0 Monocryl sutures and the skin was closed with deep dermal and subcuticular 4-0 Monocryl sutures.  Dermabond was applied.  A breast binder and ABDs were placed.  The nipple and skin flaps had good capillary refill at the end of the procedure.  The patient tolerated the procedure well. The patient was allowed to wake from anesthesia and taken to the recovery  room in satisfactory condition.  The advanced practice practitioner (APP) assisted throughout the case.  The APP was essential in retraction and counter traction when needed to make the case progress smoothly.  This retraction and assistance made it possible to see the tissue plans for the procedure.  The assistance was needed for blood control, tissue re-approximation and assisted with closure of the incision site.

## 2021-03-28 ENCOUNTER — Other Ambulatory Visit (HOSPITAL_COMMUNITY): Payer: Self-pay

## 2021-03-28 ENCOUNTER — Encounter (HOSPITAL_BASED_OUTPATIENT_CLINIC_OR_DEPARTMENT_OTHER): Payer: Self-pay | Admitting: Plastic Surgery

## 2021-03-28 DIAGNOSIS — N62 Hypertrophy of breast: Secondary | ICD-10-CM | POA: Diagnosis not present

## 2021-03-28 LAB — SURGICAL PATHOLOGY

## 2021-03-28 MED ORDER — METHOCARBAMOL 500 MG PO TABS
500.0000 mg | ORAL_TABLET | Freq: Two times a day (BID) | ORAL | 0 refills | Status: AC | PRN
  Filled 2021-03-28: qty 20, 10d supply, fill #0

## 2021-03-28 NOTE — Discharge Summary (Signed)
Physician Discharge Summary  Patient ID: Kerry Potter MRN: [id number] DOB/AGE: [birth year] 36 y.o.  Admit date: 03/27/2021 Discharge date: 03/28/2021  Admission Diagnoses:  Discharge Diagnoses:  Principal Problem:   Macromastia   Discharged Condition: Patient is doing well this morning.  She had episodes of feeling woozy after surgery yesterday and was admitted overnight for observation.  She suspected that it was possibly related to the nitroglycerin paste applied to left nipple.  She feels improved today.  Denies any considerable pain symptoms, states that her discomfort is well controlled with the Norco and Robaxin that was given overnight.  She was able to sleep.  She has ambulated to the bathroom multiple times and is voiding.  Tolerating p.o. intake without difficulty.  Husband will be picking her up.  Feels prepared for discharge.  Plans to continue applying the nitroglycerin paste, as directed.  Will prescribe short course of Robaxin given that it helped with her discomfort and difficulty sleeping.  Cautioned her about taking it in conjunction with Norco, as it can make her drowsy and prone to falls.  Hospital Course: Admitted overnight for observation s/p bilateral breast reduction with liposuction and excision of mole on left side of face  Consults: None  Significant Diagnostic Studies: None  Treatments: surgery: Bilateral breast reduction with liposuction as well as excision of mole on left side of face  Discharge Exam: Blood pressure (!) 104/56, pulse 82, temperature 97.9 F (36.6 C), resp. rate 16, height 5\' 6"  (1.676 m), weight 81.4 kg, SpO2 98 %. General appearance: alert, cooperative, and no acute distress Chest wall: Symmetric, no considerable bruising or swelling noted.  Honeycomb dressings intact.  No obvious wounds or dehiscence.  No considerable redness appreciated.  NAC's are pink and viable.  No duskiness noted. Extremities: SCDs  Disposition: Discharge disposition:  01-Home or Self Care       Discharge Instructions     Diet - low sodium heart healthy   Complete by: As directed    Increase activity slowly   Complete by: As directed       Allergies as of 03/28/2021       Reactions   Mobic [meloxicam] Diarrhea        Medication List     TAKE these medications    levonorgestrel 20 MCG/24HR IUD Commonly known as: MIRENA 1 each by Intrauterine route once.   methocarbamol 500 MG tablet Commonly known as: Robaxin Take 1 tablet (500 mg total) by mouth 2 (two) times daily as needed for up to 10 days for muscle spasms.   ondansetron 4 MG tablet Commonly known as: Zofran Take 1 tablet (4 mg total) by mouth every 8 (eight) hours as needed for nausea or vomiting.        Follow-up Information     Dillingham, Loel Lofty, DO Follow up in 10 day(s).   Specialty: Plastic Surgery Contact information: [address] [place] [address] [facility] [address] [place], [place] [address] [phone]  Signed: Krista Blue 03/28/2021, 6:34 AM

## 2021-04-01 ENCOUNTER — Encounter: Payer: Self-pay | Admitting: Plastic Surgery

## 2021-04-04 ENCOUNTER — Encounter: Payer: Self-pay | Admitting: Plastic Surgery

## 2021-04-04 ENCOUNTER — Other Ambulatory Visit: Payer: Self-pay

## 2021-04-04 ENCOUNTER — Ambulatory Visit (INDEPENDENT_AMBULATORY_CARE_PROVIDER_SITE_OTHER): Payer: 59 | Admitting: Plastic Surgery

## 2021-04-04 DIAGNOSIS — N62 Hypertrophy of breast: Secondary | ICD-10-CM

## 2021-04-04 NOTE — Progress Notes (Signed)
The patient is a 36 year old female here with her husband for follow-up after undergoing bilateral breast reduction and excision of a skin lesion on her face.  The facial lesion was an intradermal nevus.  The path from the breast was all negative.  She had 562 g removed from the right breast and 700 g removed from the left breast.  She has a little bit of a seroma in the right breast but the skin is not tight.  The nipple areola looks good on both sides.  The dressing is in place and dry.  Continue with the breast binder or sports bra.  Follow-up with any questions or concerns before her 3-week visit if needed.

## 2021-04-10 DIAGNOSIS — Z30431 Encounter for routine checking of intrauterine contraceptive device: Secondary | ICD-10-CM | POA: Diagnosis not present

## 2021-04-11 ENCOUNTER — Telehealth: Payer: Self-pay | Admitting: Genetic Counselor

## 2021-04-11 NOTE — Telephone Encounter (Signed)
Returned patient message about questions regarding genetics appts.  Called patient and no answer.  LVM with contact information.

## 2021-04-11 NOTE — Telephone Encounter (Signed)
Patient asked if she could bring husband and sister to her genetics appt.  Let her know that is okay.  Sister may also be interested in genetic testing.  Provided CPT codes for genetic testing and genetics appt per patient request.  Also briefly explained billing policies of genetics labs.

## 2021-04-16 ENCOUNTER — Other Ambulatory Visit: Payer: 59

## 2021-04-16 ENCOUNTER — Encounter: Payer: 59 | Admitting: Genetic Counselor

## 2021-04-18 ENCOUNTER — Ambulatory Visit (INDEPENDENT_AMBULATORY_CARE_PROVIDER_SITE_OTHER): Payer: 59 | Admitting: Surgical

## 2021-04-18 ENCOUNTER — Other Ambulatory Visit: Payer: Self-pay

## 2021-04-18 DIAGNOSIS — G8929 Other chronic pain: Secondary | ICD-10-CM

## 2021-04-18 DIAGNOSIS — M546 Pain in thoracic spine: Secondary | ICD-10-CM

## 2021-04-18 DIAGNOSIS — N62 Hypertrophy of breast: Secondary | ICD-10-CM

## 2021-04-18 DIAGNOSIS — M542 Cervicalgia: Secondary | ICD-10-CM

## 2021-04-18 NOTE — Progress Notes (Signed)
35 year old female here for follow-up after bilateral breast reduction and excision of skin lesion of her face with Dr. Marla Roe on 03/27/2021.  She is 3 weeks postop.  She reports overall she is doing well, she has some itching that she is noticing.  She also reports she notices a stitch protruding through her left cheek incision.  Chaperone present on exam On exam bilateral NAC's are viable, bilateral breast incisions are intact and healing well.  There is no erythema or subcutaneous fluid collections noted palpation.  She does have some mild swelling still present.  She does have a little bit of a contour abnormality in the left lateral breast where there is some mild volume loss causing a small shape change.  Continue with compressive garments.  Continue to avoid strenuous activities or heavy lifting.  3 more weeks of restrictions.  Discussed with patient that the contracted mallet in the left lateral breast is likely related to swelling, this should settle in the next few weeks.  Recommend continue to evaluate this area.  She can begin using scar creams.  Pictures were obtained of the patient and placed in the chart with the patient's or guardian's permission.

## 2021-05-12 ENCOUNTER — Other Ambulatory Visit: Payer: Self-pay | Admitting: Genetic Counselor

## 2021-05-12 DIAGNOSIS — Z8041 Family history of malignant neoplasm of ovary: Secondary | ICD-10-CM

## 2021-05-14 ENCOUNTER — Inpatient Hospital Stay: Payer: 59

## 2021-05-14 ENCOUNTER — Inpatient Hospital Stay: Payer: 59 | Attending: Genetic Counselor | Admitting: Genetic Counselor

## 2021-05-14 ENCOUNTER — Encounter: Payer: Self-pay | Admitting: Genetic Counselor

## 2021-05-14 ENCOUNTER — Other Ambulatory Visit: Payer: Self-pay

## 2021-05-14 DIAGNOSIS — Z803 Family history of malignant neoplasm of breast: Secondary | ICD-10-CM | POA: Diagnosis not present

## 2021-05-14 DIAGNOSIS — Z8052 Family history of malignant neoplasm of bladder: Secondary | ICD-10-CM

## 2021-05-14 DIAGNOSIS — Z8042 Family history of malignant neoplasm of prostate: Secondary | ICD-10-CM | POA: Diagnosis not present

## 2021-05-14 DIAGNOSIS — Z8041 Family history of malignant neoplasm of ovary: Secondary | ICD-10-CM

## 2021-05-14 LAB — GENETIC SCREENING ORDER

## 2021-05-14 NOTE — Progress Notes (Addendum)
REFERRING PROVIDER: ?Charyl Bigger, MD ?[address] ?[place],  [place] [address] ? ?PRIMARY PROVIDER:  ?Billie Ruddy, MD ? ?PRIMARY REASON FOR VISIT:  ?1. Family history of breast cancer   ?2. Family history of ovarian cancer   ?3. Family history of prostate cancer   ?4. Family history of bladder cancer   ? ? ? ?HISTORY OF PRESENT ILLNESS:   ?Kerry Potter, a 36 y.o. female, was seen for a [facility] cancer genetics consultation at the request of Dr. Murrell Redden due to a family history of ovarian, breast, prostate and bladder cancers.  Kerry Potter presents to clinic today to discuss the possibility of a hereditary predisposition to cancer, genetic testing, and to further clarify her future cancer risks, as well as potential cancer risks for family members.  ? ?Kerry Potter is a 36 y.o. female with no personal history of cancer.   ? ?CANCER HISTORY:  ?Oncology History  ? No history exists.  ? ? ? ?RISK FACTORS:  ?Menarche was at age 36.  ?First live birth at age 62.  ?OCP use for approximately  <5  years.  ?Ovaries intact: yes.  ?Hysterectomy: no.  ?Menopausal status: premenopausal.  ?HRT use: 0 years. ?Colonoscopy: no; not examined. ?Mammogram within the last year: yes. ?Number of breast biopsies: 0. ?Up to date with pelvic exams: yes. ?Any excessive radiation exposure in the past: no ? ?Past Medical History:  ?Diagnosis Date  ? Acute blood loss anemia 12/10/2015  ? Family history of bladder cancer   ? Family history of breast cancer   ? Family history of ovarian cancer   ? Family history of prostate cancer   ? Medical history non-contributory   ? ? ?Past Surgical History:  ?Procedure Laterality Date  ? BREAST REDUCTION SURGERY Bilateral 03/27/2021  ? Procedure: BREAST REDUCTION WITH LIPOSUCTION;  Surgeon: Wallace Going, DO;  Location: [facility];  Service: Plastics;  Laterality: Bilateral;  3 hours  ? LIPOMA EXCISION N/A 03/27/2021  ? Procedure: excision of mole on face;  Surgeon: Wallace Going,  DO;  Location: [facility];  Service: Plastics;  Laterality: N/A;  ? OVARIAN CYST REMOVAL Left 2004  ? ? ?Social History  ? ?Socioeconomic History  ? Marital status: Married  ?  Spouse name: Not on file  ? Number of children: 2  ? Years of education: Not on file  ? Highest education level: Not on file  ?Occupational History  ? Not on file  ?Tobacco Use  ? Smoking status: Never  ? Smokeless tobacco: Never  ?Vaping Use  ? Vaping Use: Never used  ?Substance and Sexual Activity  ? Alcohol use: Yes  ?  Comment: social  ? Drug use: No  ? Sexual activity: Yes  ?  Birth control/protection: I.U.D.  ?Other Topics Concern  ? Not on file  ?Social History Narrative  ? Not on file  ? ?Social Determinants of Health  ? ?Financial Resource Strain: Not on file  ?Food Insecurity: Not on file  ?Transportation Needs: Not on file  ?Physical Activity: Not on file  ?Stress: Not on file  ?Social Connections: Not on file  ?  ? ?FAMILY HISTORY:  ?We obtained a detailed, 4-generation family history.  Significant diagnoses are listed below: ?Family History  ?Problem Relation Age of Onset  ? Ovarian cancer Mother 51  ? Prostate cancer Father 60  ? Hypertension Father   ? Breast cancer Maternal Aunt   ? Bladder Cancer Paternal Uncle   ?  Prostate cancer Paternal Uncle   ? Bladder Cancer Paternal Grandmother 57  ? Hypertension Paternal Grandmother   ? Melanoma Paternal Grandmother   ? Prostate cancer Paternal Grandfather   ? Heart disease Paternal Grandfather   ? Hypertension Paternal Grandfather   ? Diabetes Paternal Grandfather   ? ? ? ?The patient has two daughters who are cancer free.  She has one sister who is cancer free. Her mother is deceased and her father is living. ? ?The patient's mother was diagnosed with ovarian cancer around age 20.  She was adopted, but has a sister who died of breast cancer.  There is no other family history of cancer known on the maternal side. ? ?The patient's father was diagnosed with prostate  cancer in his 36's.  He had two brothers.  One died of bladder cancer, and possibly had prostate cancer.  The paternal grandmother had bladder cancer in her 65's and melanoma.  The grandfather had prostate cancer. ? ?Kerry Potter is unaware of previous family history of genetic testing for hereditary cancer risks. Patient's maternal ancestors are of Caucasian descent, and paternal ancestors are of [place] and [place] descent. There is no reported Ashkenazi Jewish ancestry. There is no known consanguinity. ? ?GENETIC COUNSELING ASSESSMENT: Kerry Potter is a 36 y.o. female with a family history of breast, ovarian, prostate and bladder cancer which is somewhat suggestive of a hereditary cancer syndrome and predisposition to cancer given the combination of cancer and the young age of onset. We, therefore, discussed and recommended the following at today's visit.  ? ?DISCUSSION: We discussed that, in general, most cancer is not inherited in families, but instead is sporadic or familial. Sporadic cancers occur by chance and typically happen at older ages (>50 years) as this type of cancer is caused by genetic changes acquired during an individual?s lifetime. Some families have more cancers than would be expected by chance; however, the ages or types of cancer are not consistent with a known genetic mutation or known genetic mutations have been ruled out. This type of familial cancer is thought to be due to a combination of multiple genetic, environmental, hormonal, and lifestyle factors. While this combination of factors likely increases the risk of cancer, the exact source of this risk is not currently identifiable or testable. ? ?We discussed that up to 20% of ovarian cancer is hereditary, with most cases associated with BRCA mutations.  There are other genes that can be associated with hereditary ovarian cancer syndromes.  These include BRIP1, RAD51C, RAD51D and the Lynch syndrome genes.  We discussed that testing is beneficial  for several reasons including knowing how to follow individuals after completing their treatment, identifying whether potential treatment options such as PARP inhibitors would be beneficial, and understand if other family members could be at risk for cancer and allow them to undergo genetic testing.  ? ?We reviewed the characteristics, features and inheritance patterns of hereditary cancer syndromes. We also discussed genetic testing, including the appropriate family members to test, the process of testing, insurance coverage and turn-around-time for results. We discussed the implications of a negative, positive, carrier and/or variant of uncertain significant result. We recommended Kerry Potter pursue genetic testing for the CancerNext-Expanded+RNAinsight gene panel.  ? ?The CancerNext-Expanded gene panel offered by [facility] and includes sequencing and rearrangement analysis for the following 77 genes: AIP, ALK, APC*, ATM*, AXIN2, BAP1, BARD1, BLM, BMPR1A, BRCA1*, BRCA2*, BRIP1*, CDC73, CDH1*, CDK4, CDKN1B, CDKN2A, CHEK2*, CTNNA1, DICER1, FANCC, FH, FLCN, GALNT12, KIF1B, LZTR1,  MAX, MEN1, MET, MLH1*, MSH2*, MSH3, MSH6*, MUTYH*, NBN, NF1*, NF2, NTHL1, PALB2*, PHOX2B, PMS2*, POT1, PRKAR1A, PTCH1, PTEN*, RAD51C*, RAD51D*, RB1, RECQL, RET, SDHA, SDHAF2, SDHB, SDHC, SDHD, SMAD4, SMARCA4, SMARCB1, SMARCE1, STK11, SUFU, TMEM127, TP53*, TSC1, TSC2, VHL and XRCC2 (sequencing and deletion/duplication); EGFR, EGLN1, HOXB13, KIT, MITF, PDGFRA, POLD1, and POLE (sequencing only); EPCAM and GREM1 (deletion/duplication only). DNA and RNA analyses performed for * genes.  ? ?We discussed that some people do not want to undergo genetic testing due to fear of genetic discrimination.  A federal law called the Genetic Information Non-Discrimination Act (GINA) of 2008 helps protect individuals against genetic discrimination based on their genetic test results.  It impacts both health insurance and employment.  With health insurance, it  protects against increased premiums, being kicked off insurance or being forced to take a test in order to be insured.  For employment it protects against hiring, firing and promoting decisions based on g

## 2021-05-16 ENCOUNTER — Encounter: Payer: Self-pay | Admitting: Genetic Counselor

## 2021-05-27 ENCOUNTER — Encounter: Payer: Self-pay | Admitting: Genetic Counselor

## 2021-05-27 ENCOUNTER — Telehealth: Payer: Self-pay | Admitting: Genetic Counselor

## 2021-05-27 DIAGNOSIS — Z1379 Encounter for other screening for genetic and chromosomal anomalies: Secondary | ICD-10-CM | POA: Insufficient documentation

## 2021-05-27 NOTE — Telephone Encounter (Signed)
LM on VM that results are back and to please call. 

## 2021-05-28 NOTE — Telephone Encounter (Signed)
LM on VM that results are back and to please call. 2nd attempt ?

## 2021-05-29 ENCOUNTER — Ambulatory Visit: Payer: Self-pay | Admitting: Genetic Counselor

## 2021-05-29 DIAGNOSIS — Z1379 Encounter for other screening for genetic and chromosomal anomalies: Secondary | ICD-10-CM

## 2021-05-29 NOTE — Telephone Encounter (Signed)
Revealed negative genetic testing.  Discussed that we do not know why there is cancer in the family. It could be due to a different gene that we are not testing, or maybe our current technology may not be able to pick something up.  It will be important for her to keep in contact with genetics to keep up with whether additional testing may be needed.  

## 2021-05-29 NOTE — Progress Notes (Signed)
HPI:  [name] was previously seen in the [facility] clinic due to a family history of breast ovarian and prostate cancer and concerns regarding a hereditary predisposition to cancer. Please refer to our prior cancer genetics clinic note for more information regarding our discussion, assessment and recommendations, at the time. [name] recent genetic test results were disclosed to her, as were recommendations warranted by these results. These results and recommendations are discussed in more detail below. ? ?CANCER HISTORY:  ?Oncology History  ? No history exists.  ? ? ?FAMILY HISTORY:  ?We obtained a detailed, 4-generation family history.  Significant diagnoses are listed below: ?Family History  ?Problem Relation Age of Onset  ? Ovarian cancer Mother 69  ? Prostate cancer Father 2  ? Hypertension Father   ? Aneurysm Maternal Grandmother   ? Arthritis Maternal Grandfather   ? Dementia Maternal Grandfather   ? COPD Maternal Grandfather   ? Hypertension Maternal Grandfather   ? Bladder Cancer Paternal Grandmother 69  ? Hypertension Paternal Grandmother   ? Melanoma Paternal Grandmother   ? Prostate cancer Paternal Grandfather   ? Heart disease Paternal Grandfather   ? Hypertension Paternal Grandfather   ? Diabetes Paternal Grandfather   ? Appendicitis Maternal Aunt   ? Cancer Maternal Aunt   ?     in pelvis  ? Breast cancer Maternal Aunt   ?     bilateral  ? Appendicitis Maternal Aunt   ? Breast cancer Maternal Aunt   ? Arthritis Maternal Uncle   ? Hypertension Maternal Uncle   ? Alcohol abuse Maternal Uncle   ? Heart disease Maternal Uncle   ? Arthritis Maternal Uncle   ? Alcohol abuse Maternal Uncle   ? Hypertension Maternal Uncle   ? Appendicitis Maternal Uncle   ? Bladder Cancer Paternal Uncle   ? Prostate cancer Paternal Uncle   ?     metastatic  ? Prostate cancer Paternal Uncle   ? Prostate cancer Paternal Great-grandfather   ? Brain cancer Other   ?     sister of PGF  ? Breast cancer Other    ?     2 sisters of PGF  ? ? ?  ?The patient has two daughters who are cancer free.  She has one sister who is cancer free. Her mother is deceased and her father is living. ?  ?The patient's mother was diagnosed with ovarian cancer around age 61.  She was adopted, but has a sister who died of breast cancer.  There is no other family history of cancer known on the maternal side. ?  ?The patient's father was diagnosed with prostate cancer in his 22's.  He had two brothers.  One died of bladder cancer, and possibly had prostate cancer.  The paternal grandmother had bladder cancer in her 55's and melanoma.  The grandfather had prostate cancer. ?  ?[name] is unaware of previous family history of genetic testing for hereditary cancer risks. Patient's maternal ancestors are of Caucasian descent, and paternal ancestors are of [place] and [place] descent. There is no reported Ashkenazi Jewish ancestry. There is no known consanguinity. ? ?GENETIC TEST RESULTS: Genetic testing reported out on May 24, 2021 through the CancerNext-Expanded+RNAinsight cancer panel found no pathogenic mutations. The CancerNext-Expanded gene panel offered by [facility] and includes sequencing and rearrangement analysis for the following 77 genes: AIP, ALK, APC, ATM, AXIN2, BAP1, BARD1, BLM, BMPR1A, BRCA1, BRCA2, BRIP1, CDC73, CDH1, CDK4, CDKN1B, CDKN2A, CHEK2, CTNNA1, DICER1,  FANCC, FH, FLCN, GALNT12, KIF1B, LZTR1, MAX, MEN1, MET, MLH1, MSH2, MSH3, MSH6, MUTYH, NBN, NF1, NF2, NTHL1, PALB2, PHOX2B, PMS2, POT1, PRKAR1A, PTCH1, PTEN, RAD51C, RAD51D, RB1, RECQL, RET, SDHA, SDHAF2, SDHB, SDHC, SDHD, SMAD4, SMARCA4, SMARCB1, SMARCE1, STK11, SUFU, TMEM127, TP53, TSC1, TSC2, VHL and XRCC2 (sequencing and deletion/duplication); EGFR, EGLN1, HOXB13, KIT, MITF, PDGFRA, POLD1, and POLE (sequencing only); EPCAM and GREM1 (deletion/duplication only). The test report has been scanned into EPIC and is located under the Molecular Pathology section of the  Results Review tab.  A portion of the result report is included below for reference.  ? ? ? ?We discussed with [name] that because current genetic testing is not perfect, it is possible there may be a gene mutation in one of these genes that current testing cannot detect, but that chance is small.  We also discussed, that there could be another gene that has not yet been discovered, or that we have not yet tested, that is responsible for the cancer diagnoses in the family. It is also possible there is a hereditary cause for the cancer in the family that [name] did not inherit and therefore was not identified in her testing.  Therefore, it is important to remain in touch with cancer genetics in the future so that we can continue to offer [name] the most up to date genetic testing.  ? ?ADDITIONAL GENETIC TESTING: We discussed with [name] that her genetic testing was fairly extensive.  If there are genes identified to increase cancer risk that can be analyzed in the future, we would be happy to discuss and coordinate this testing at that time.   ? ?CANCER SCREENING RECOMMENDATIONS: [name] test result is considered negative (normal).  This means that we have not identified a hereditary cause for her family history of cancer at this time. Most cancers happen by chance and this negative test suggests that her cancer may fall into this category.   ? ?While reassuring, this does not definitively rule out a hereditary predisposition to cancer. It is still possible that there could be genetic mutations that are undetectable by current technology. There could be genetic mutations in genes that have not been tested or identified to increase cancer risk.  Therefore, it is recommended she continue to follow the cancer management and screening guidelines provided by her primary healthcare provider.  ? ?An individual's cancer risk and medical management are not determined by genetic test results alone. Overall cancer  risk assessment incorporates additional factors, including personal medical history, family history, and any available genetic information that may result in a personalized plan for cancer prevention and surveillance ? ?RECOMMENDATIONS FOR FAMILY MEMBERS:  Individuals in this family might be at some increased risk of developing cancer, over the general population risk, simply due to the family history of cancer.  We recommended women in this family have a yearly mammogram beginning at age 14, or 66 years younger than the earliest onset of cancer, an annual clinical breast exam, and perform monthly breast self-exams. Women in this family should also have a gynecological exam as recommended by their primary provider. All family members should be referred for colonoscopy starting at age 38. ? ?It is also possible there is a hereditary cause for the cancer in [name] family that she did not inherit and therefore was not identified in her.  Based on [name] family history, we recommended her father, who was diagnosed with prostate cancer at age 8, or her maternal  aunt who was diagnosed with breast cancer, have genetic counseling and testing. [name] will let us know if we can be of any assistance in coordinating genetic counseling and/or testing for this family member.  ? ?FOLLOW-UP: Lastly, we discussed with [name] that cancer genetics is a rapidly advancing field and it is possible that new genetic tests will be appropriate for her and/or her family members in the future. We encouraged her to remain in contact with cancer genetics on an annual basis so we can update her personal and family histories and let her know of advances in cancer genetics that may benefit this family.  ? ?Our contact number was provided. [name] questions were answered to her satisfaction, and she knows she is welcome to call us at anytime with additional questions or concerns.  ? ?Roma Kayser, MS, [place] ?Licensed, Geophysical data processor ?[email or web address] ? ?

## 2021-05-29 NOTE — Telephone Encounter (Signed)
LM on VM that results are back and to please call.  3rd attempt. ?

## 2021-06-11 ENCOUNTER — Telehealth: Payer: Self-pay | Admitting: Plastic Surgery

## 2021-06-11 NOTE — Telephone Encounter (Signed)
Patient called and reported that the skin is not healing properly around her surgical wound and stitches. She states that the skin around a part of the stitches is poking out. She states that it is not bleeding.  ?

## 2021-06-11 NOTE — Telephone Encounter (Signed)
Called and [facility] @ 4:45pm) asking the patient to RTC regarding the messsage below.//AB/CMA ?

## 2021-06-12 NOTE — Telephone Encounter (Signed)
Spoke with the patient regarding the message below.  Patient stated that she has a stitch that's sticking out of her skin and she's not able to remove it, so she would like to come in and have it removed.   ? ?Informed the patient that I spoke with [facility] and he stated that she can come in on (Friday-06/13/21) in the morning, and he will remove it.  Patient verbalized understanding and agreed.   Patient stated that she can come in around (9:40am).  Patient was scheduled.//AB/CMA ?

## 2021-06-13 ENCOUNTER — Ambulatory Visit (INDEPENDENT_AMBULATORY_CARE_PROVIDER_SITE_OTHER): Payer: 59 | Admitting: Physician Assistant

## 2021-06-13 DIAGNOSIS — Z9889 Other specified postprocedural states: Secondary | ICD-10-CM

## 2021-06-13 NOTE — Progress Notes (Signed)
Patient is a 36 year old female with PMH of macromastia s/p bilateral breast reduction performed 03/27/2021 by Dr. Marla Roe who presents to clinic for postoperative follow-up.  At that time, excision of facial skin lesion was also performed. ? ?Patient called the office complaining of a protruding stitch that she was unable to remove independently. ? ?On exam, patient is doing well.  She states that she has been applying [place] to her scars for the past couple of months.  However, when going along the right inframammary incision, she rubs against a protruding stitch that is painful.  She is hoping that it can perhaps be trimmed.  She states that her back pain has improved since her reduction, but is also still hoping that she could be mildly smaller.  Discussed how breast size can continue to fluctuate with weight change. ? ?On physical exam, there is a small, sharp, bump from the right inframammary incision that is tender to touch.  Small associated superficial wound.  No surrounding cellulitic changes. ? ?Attempted to snip the suture down is much as possible.  Did not want to do any invasive exploration.  Discussed how this is an absorbable stitch that will eventually dissolve.  In the interim, recommend that she apply thin coat of Vaseline and Band-Aid so that it does not cause any significant pain with rubbing.  She may have to abstain from scar cream at that small focal area until it heals. ? ?She can call the clinic should she develop any additional questions or concerns.  No specific follow-up needed. ? ? ?

## 2021-09-08 ENCOUNTER — Ambulatory Visit: Payer: 59 | Admitting: Family Medicine

## 2021-09-08 ENCOUNTER — Encounter: Payer: Self-pay | Admitting: Family Medicine

## 2021-09-08 ENCOUNTER — Other Ambulatory Visit (HOSPITAL_COMMUNITY): Payer: Self-pay

## 2021-09-08 VITALS — BP 110/78 | HR 88 | Temp 98.5°F | Wt 170.4 lb

## 2021-09-08 DIAGNOSIS — H65192 Other acute nonsuppurative otitis media, left ear: Secondary | ICD-10-CM | POA: Diagnosis not present

## 2021-09-08 DIAGNOSIS — H1033 Unspecified acute conjunctivitis, bilateral: Secondary | ICD-10-CM | POA: Diagnosis not present

## 2021-09-08 DIAGNOSIS — J302 Other seasonal allergic rhinitis: Secondary | ICD-10-CM

## 2021-09-08 DIAGNOSIS — J0140 Acute pansinusitis, unspecified: Secondary | ICD-10-CM

## 2021-09-08 MED ORDER — AMOXICILLIN 500 MG PO CAPS
500.0000 mg | ORAL_CAPSULE | Freq: Two times a day (BID) | ORAL | 0 refills | Status: AC
  Filled 2021-09-08: qty 14, 7d supply, fill #0

## 2021-09-08 MED ORDER — POLYMYXIN B-TRIMETHOPRIM 10000-0.1 UNIT/ML-% OP SOLN
1.0000 [drp] | OPHTHALMIC | 0 refills | Status: AC
  Filled 2021-09-08: qty 10, 7d supply, fill #0

## 2021-09-08 NOTE — Progress Notes (Signed)
Subjective:    Patient ID: Kerry Potter, female    DOB: 18-Oct-1985, 36 y.o.   MRN: [id number]  Chief Complaint  Patient presents with   Nasal Congestion    Patient    Sinus Problem    Patient c/o of sinus problem. Started on Monday, 7/3. Pt reports pressure on forehead, temple. Earache on both ears. Nasal congestion and thick yellow phlegm.     HPI Patient was seen today for acute concern.  Patient endorses developing allergy-like symptoms after camping last Monday.  Patient then developed erythema, drainage, and irritation of both eyes.  Pt also with cough worse at night around 11 pm, raspy voice, ear pressure, nasal congestion, and pressure in temples and forehead.  Tried OTC Tylenol cold and sinus, Alka-Seltzer cold and sinus, NyQuil, Claritin-D.  Patient endorses negative home COVID test yesterday.  Past Medical History:  Diagnosis Date   Acute blood loss anemia 12/10/2015   Family history of bladder cancer    Family history of breast cancer    Family history of ovarian cancer    Family history of prostate cancer    Medical history non-contributory     Allergies  Allergen Reactions   Mobic [Meloxicam] Diarrhea    ROS General: Denies fever, chills, night sweats, changes in weight, changes in appetite HEENT: Denies headaches, changes in vision, rhinorrhea, sore throat  +hoarse voice, facial pain/pressure, ear pressure, nasal congestion. CV: Denies CP, palpitations, SOB, orthopnea Pulm: Denies SOB, cough, wheezing GI: Denies abdominal pain, nausea, vomiting, diarrhea, constipation GU: Denies dysuria, hematuria, frequency, vaginal discharge Msk: Denies muscle cramps, joint pains Neuro: Denies weakness, numbness, tingling Skin: Denies rashes, bruising Psych: Denies depression, anxiety, hallucinations     Objective:    Blood pressure 110/78, pulse 88, temperature 98.5 F (36.9 C), temperature source Oral, weight 170 lb 6.4 oz (77.3 kg), SpO2 98 %.  Gen. Pleasant,  well-nourished, in no distress, normal affect, hoarse voice HEENT: [place]/AT, face symmetric, conjunctiva clear, no scleral icterus, allergic shiners bilaterally, PERRLA, EOMI, TTP of the ethmoid and maxillary sinuses.  Nares patent with erythema and mild thickened drainage, pharynx with clear postnasal drainage, no erythema or exudate.  Left TM full with mild erythema.  Right TM full without erythema. Lungs: no accessory muscle use, CTAB, no wheezes or rales Cardiovascular: RRR, no m/r/g, no peripheral edema Musculoskeletal: No deformities, no cyanosis or clubbing, normal tone Neuro:  A&Ox3, CN II-XII intact, normal gait Skin:  Warm, no lesions/ rash   Wt Readings from Last 3 Encounters:  09/08/21 170 lb 6.4 oz (77.3 kg)  03/27/21 179 lb 7.3 oz (81.4 kg)  03/11/21 177 lb 3.2 oz (80.4 kg)    Lab Results  Component Value Date   WBC 7.8 10/18/2020   HGB 13.3 10/18/2020   HCT 39.4 10/18/2020   PLT 222.0 10/18/2020   GLUCOSE 73 10/18/2020   CHOL 158 10/18/2020   TRIG 60.0 10/18/2020   HDL 48.20 10/18/2020   LDLCALC 98 10/18/2020   ALT 34 10/13/2016   AST 18 10/13/2016   NA 138 10/18/2020   K 3.8 10/18/2020   CL 103 10/18/2020   CREATININE 0.65 10/18/2020   BUN 13 10/18/2020   CO2 26 10/18/2020   TSH 1.10 10/18/2020   HGBA1C 5.3 10/18/2020    Assessment/Plan:  Acute pansinusitis, recurrence not specified  -Continue OTC allergy medication such as Claritin, Allegra, Zyrtec, etc. and Flonase or saline nasal rinse -Start ABX -Given precautions - Plan: amoxicillin (AMOXIL) 500 MG capsule  Acute  bacterial conjunctivitis of both eyes  - Plan: trimethoprim-polymyxin b (POLYTRIM) ophthalmic solution  Acute effusion of left ear -Likely 2/2 allergies and sinusitis - Plan: amoxicillin (AMOXIL) 500 MG capsule  Seasonal allergies -Discussed use of OTC p.o. antihistamine, saline nasal rinse, or Flonase nasal spray  F/u as needed  Grier Mitts, MD

## 2021-09-09 DIAGNOSIS — L578 Other skin changes due to chronic exposure to nonionizing radiation: Secondary | ICD-10-CM | POA: Diagnosis not present

## 2021-09-09 DIAGNOSIS — Z411 Encounter for cosmetic surgery: Secondary | ICD-10-CM | POA: Diagnosis not present

## 2021-09-09 DIAGNOSIS — L814 Other melanin hyperpigmentation: Secondary | ICD-10-CM | POA: Diagnosis not present

## 2021-09-09 DIAGNOSIS — L700 Acne vulgaris: Secondary | ICD-10-CM | POA: Diagnosis not present

## 2021-09-09 DIAGNOSIS — D225 Melanocytic nevi of trunk: Secondary | ICD-10-CM | POA: Diagnosis not present

## 2021-10-30 ENCOUNTER — Ambulatory Visit (INDEPENDENT_AMBULATORY_CARE_PROVIDER_SITE_OTHER): Payer: 59 | Admitting: Family Medicine

## 2021-10-30 ENCOUNTER — Other Ambulatory Visit (HOSPITAL_COMMUNITY): Payer: Self-pay

## 2021-10-30 ENCOUNTER — Encounter: Payer: Self-pay | Admitting: Family Medicine

## 2021-10-30 VITALS — BP 112/78 | HR 61 | Temp 98.0°F | Ht 66.0 in | Wt 172.0 lb

## 2021-10-30 DIAGNOSIS — L739 Follicular disorder, unspecified: Secondary | ICD-10-CM | POA: Diagnosis not present

## 2021-10-30 DIAGNOSIS — Z0000 Encounter for general adult medical examination without abnormal findings: Secondary | ICD-10-CM

## 2021-10-30 DIAGNOSIS — Z975 Presence of (intrauterine) contraceptive device: Secondary | ICD-10-CM | POA: Insufficient documentation

## 2021-10-30 MED ORDER — KETOCONAZOLE 2 % EX CREA
1.0000 | TOPICAL_CREAM | Freq: Every day | CUTANEOUS | 1 refills | Status: AC
  Filled 2021-10-30: qty 15, 15d supply, fill #0

## 2021-10-30 NOTE — Patient Instructions (Addendum)
Try Hibiclens topical cleanser which can be found at your local drugstore.  A prescription for ketoconazole 2% cream was sent to your pharmacy.  You can use this sparingly once a day on affected areas.  Labs were already entered.  The earliest lab appointment is at 7:20 AM.

## 2021-10-30 NOTE — Progress Notes (Signed)
Subjective:     Kerry Potter is a 36 y.o. female and is here for a comprehensive physical exam. The patient reports increased acne on face, shoulders, back, abd, and thighs.  Pt is cleansing face BID, drinking more water, and eating better.  Tried various acne cleansers.  Pt seen by Derm.  Advised not to worry about it.  Seems to have occurred at the first of the yr after having Mirena IUD switched out and breast reduction.  Pt states first IUD was "in sideways".  Symptoms seem to increase when menses would occur each month.  No longer having menses with IUD in place.    Social History   Socioeconomic History   Marital status: Married    Spouse name: Not on file   Number of children: 2   Years of education: Not on file   Highest education level: Not on file  Occupational History   Not on file  Tobacco Use   Smoking status: Never   Smokeless tobacco: Never  Vaping Use   Vaping Use: Never used  Substance and Sexual Activity   Alcohol use: Yes    Comment: social   Drug use: No   Sexual activity: Yes    Birth control/protection: I.U.D.  Other Topics Concern   Not on file  Social History Narrative   Not on file   Social Determinants of Health   Financial Resource Strain: Not on file  Food Insecurity: Not on file  Transportation Needs: Not on file  Physical Activity: Not on file  Stress: Not on file  Social Connections: Not on file  Intimate Partner Violence: Not on file   Health Maintenance  Topic Date Due   Hepatitis C Screening  Never done   INFLUENZA VACCINE  09/30/2021   COVID-19 Vaccine (1) 11/15/2021 (Originally 03/06/1990)   TETANUS/TDAP  09/08/2023   PAP SMEAR-Modifier  03/02/2024   HIV Screening  Completed   HPV VACCINES  Aged Out    The following portions of the patient's history were reviewed and updated as appropriate: allergies, current medications, past family history, past medical history, past social history, past surgical history, and problem  list.  Review of Systems Pertinent items noted in HPI and remainder of comprehensive ROS otherwise negative.   Objective:    BP 112/78 (BP Location: Right Arm, Patient Position: Sitting, Cuff Size: Normal)   Pulse 61   Temp 98 F (36.7 C) (Oral)   Ht '5\' 6"'$  (1.676 m)   Wt 172 lb 0.3 oz (78 kg)   SpO2 93%   BMI 27.76 kg/m  General appearance: alert and cooperative Head: Normocephalic, without obvious abnormality, atraumatic Eyes: conjunctivae/corneas clear. PERRL, EOM's intact. Fundi benign. Ears: normal TM's and external ear canals both ears Nose: Nares normal. Septum midline. Mucosa normal. No drainage or sinus tenderness. Throat: lips, mucosa, and tongue normal; teeth and gums normal Neck: no adenopathy, no carotid bruit, no JVD, supple, symmetrical, trachea midline, and thyroid not enlarged, symmetric, no tenderness/mass/nodules Lungs: clear to auscultation bilaterally Heart: regular rate and rhythm, S1, S2 normal, no murmur, click, rub or gallop Abdomen: soft, non-tender; bowel sounds normal; no masses,  no organomegaly Extremities: extremities normal, atraumatic, no cyanosis or edema Pulses: 2+ and symmetric Skin: Skin color, texture, turgor normal. No rashes or lesions  small 2-4 mm pustules or erythematous raised lesions at hair follicles. Lymph nodes: Cervical, supraclavicular, and axillary nodes normal. Neurologic: Alert and oriented X 3, normal strength and tone. Normal symmetric reflexes. Normal coordination and  gait    Assessment:    Healthy female exam.      Plan:    Anticipatory guidance given including wearing seatbelts, smoke detectors in the home, increasing physical activity, increasing p.o. intake of water and vegetables. -labs at pt's convenience when fasting. -pap with OB/Gyn -immunizations reviewed -next CPE in 1 yr See After Visit Summary for Counseling Recommendations   Well adult exam - Plan: CBC with Differential/Platelet, Basic metabolic panel,  TSH, T4, Free, Hemoglobin A1c, Lipid panel  Folliculitis -Consider OTC hibiclens  - Plan: CBC with Differential/Platelet, ketoconazole (NIZORAL) 2 % cream  IUD in place -mirena IUD placed 03/2021 -continue f/u with OB/gyn  F/u prn  Grier Mitts, MD

## 2021-11-03 DIAGNOSIS — R42 Dizziness and giddiness: Secondary | ICD-10-CM | POA: Diagnosis not present

## 2021-11-03 DIAGNOSIS — Z28310 Unvaccinated for covid-19: Secondary | ICD-10-CM | POA: Diagnosis not present

## 2021-11-03 DIAGNOSIS — S0083XA Contusion of other part of head, initial encounter: Secondary | ICD-10-CM | POA: Diagnosis not present

## 2021-11-03 DIAGNOSIS — S0990XA Unspecified injury of head, initial encounter: Secondary | ICD-10-CM | POA: Diagnosis not present

## 2021-11-03 DIAGNOSIS — R4781 Slurred speech: Secondary | ICD-10-CM | POA: Diagnosis not present

## 2021-11-03 DIAGNOSIS — R110 Nausea: Secondary | ICD-10-CM | POA: Diagnosis not present

## 2021-11-03 DIAGNOSIS — S0003XA Contusion of scalp, initial encounter: Secondary | ICD-10-CM | POA: Diagnosis not present

## 2021-11-25 ENCOUNTER — Other Ambulatory Visit (INDEPENDENT_AMBULATORY_CARE_PROVIDER_SITE_OTHER): Payer: 59

## 2021-11-25 DIAGNOSIS — Z0000 Encounter for general adult medical examination without abnormal findings: Secondary | ICD-10-CM

## 2021-11-25 DIAGNOSIS — L739 Follicular disorder, unspecified: Secondary | ICD-10-CM

## 2021-11-25 LAB — CBC WITH DIFFERENTIAL/PLATELET
Basophils Absolute: 0 10*3/uL (ref 0.0–0.1)
Basophils Relative: 0.5 % (ref 0.0–3.0)
Eosinophils Absolute: 0.1 10*3/uL (ref 0.0–0.7)
Eosinophils Relative: 1.9 % (ref 0.0–5.0)
HCT: 38.8 % (ref 36.0–46.0)
Hemoglobin: 13.3 g/dL (ref 12.0–15.0)
Lymphocytes Relative: 30.8 % (ref 12.0–46.0)
Lymphs Abs: 2.3 10*3/uL (ref 0.7–4.0)
MCHC: 34.2 g/dL (ref 30.0–36.0)
MCV: 88 fl (ref 78.0–100.0)
Monocytes Absolute: 0.6 10*3/uL (ref 0.1–1.0)
Monocytes Relative: 7.4 % (ref 3.0–12.0)
Neutro Abs: 4.5 10*3/uL (ref 1.4–7.7)
Neutrophils Relative %: 59.4 % (ref 43.0–77.0)
Platelets: 211 10*3/uL (ref 150.0–400.0)
RBC: 4.41 Mil/uL (ref 3.87–5.11)
RDW: 12.4 % (ref 11.5–15.5)
WBC: 7.6 10*3/uL (ref 4.0–10.5)

## 2021-11-25 LAB — BASIC METABOLIC PANEL
BUN: 13 mg/dL (ref 6–23)
CO2: 26 mEq/L (ref 19–32)
Calcium: 9.2 mg/dL (ref 8.4–10.5)
Chloride: 106 mEq/L (ref 96–112)
Creatinine, Ser: 0.76 mg/dL (ref 0.40–1.20)
GFR: 102.02 mL/min (ref >60.00)
Glucose, Bld: 87 mg/dL (ref 70–99)
Potassium: 3.9 mEq/L (ref 3.5–5.1)
Sodium: 138 mEq/L (ref 135–145)

## 2021-11-25 LAB — LIPID PANEL
Cholesterol: 151 mg/dL (ref 0–200)
HDL: 46.3 mg/dL (ref >39.00)
LDL Cholesterol: 87 mg/dL (ref 0–99)
NonHDL: 104.78
Total CHOL/HDL Ratio: 3
Triglycerides: 87 mg/dL (ref 0.0–149.0)
VLDL: 17.4 mg/dL (ref 0.0–40.0)

## 2021-11-25 LAB — TSH: TSH: 1.04 u[IU]/mL (ref 0.35–5.50)

## 2021-11-25 LAB — HEMOGLOBIN A1C: Hgb A1c MFr Bld: 5.6 % (ref 4.6–6.5)

## 2021-11-25 LAB — T4, FREE: Free T4: 0.76 ng/dL (ref 0.60–1.60)

## 2022-07-25 IMAGING — MG DIGITAL SCREENING BILAT W/ CAD
4 series · 4 of 4 positions shown · non-contrast
Comparison: None.

CLINICAL DATA: Screening.

EXAM:
DIGITAL SCREENING BILATERAL MAMMOGRAM WITH CAD

[L CC]
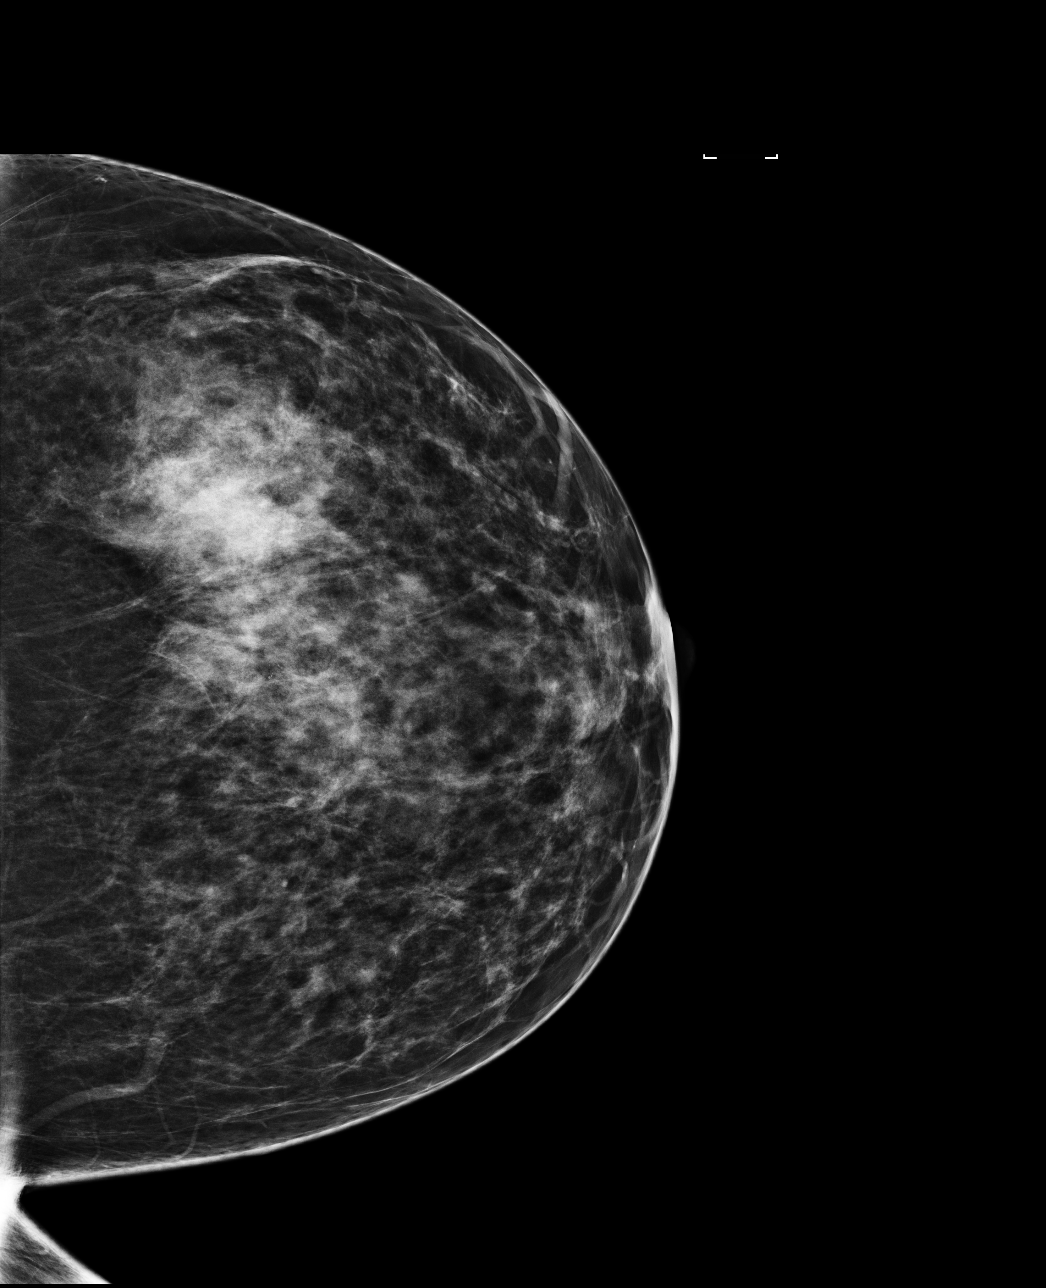

[L MLO]
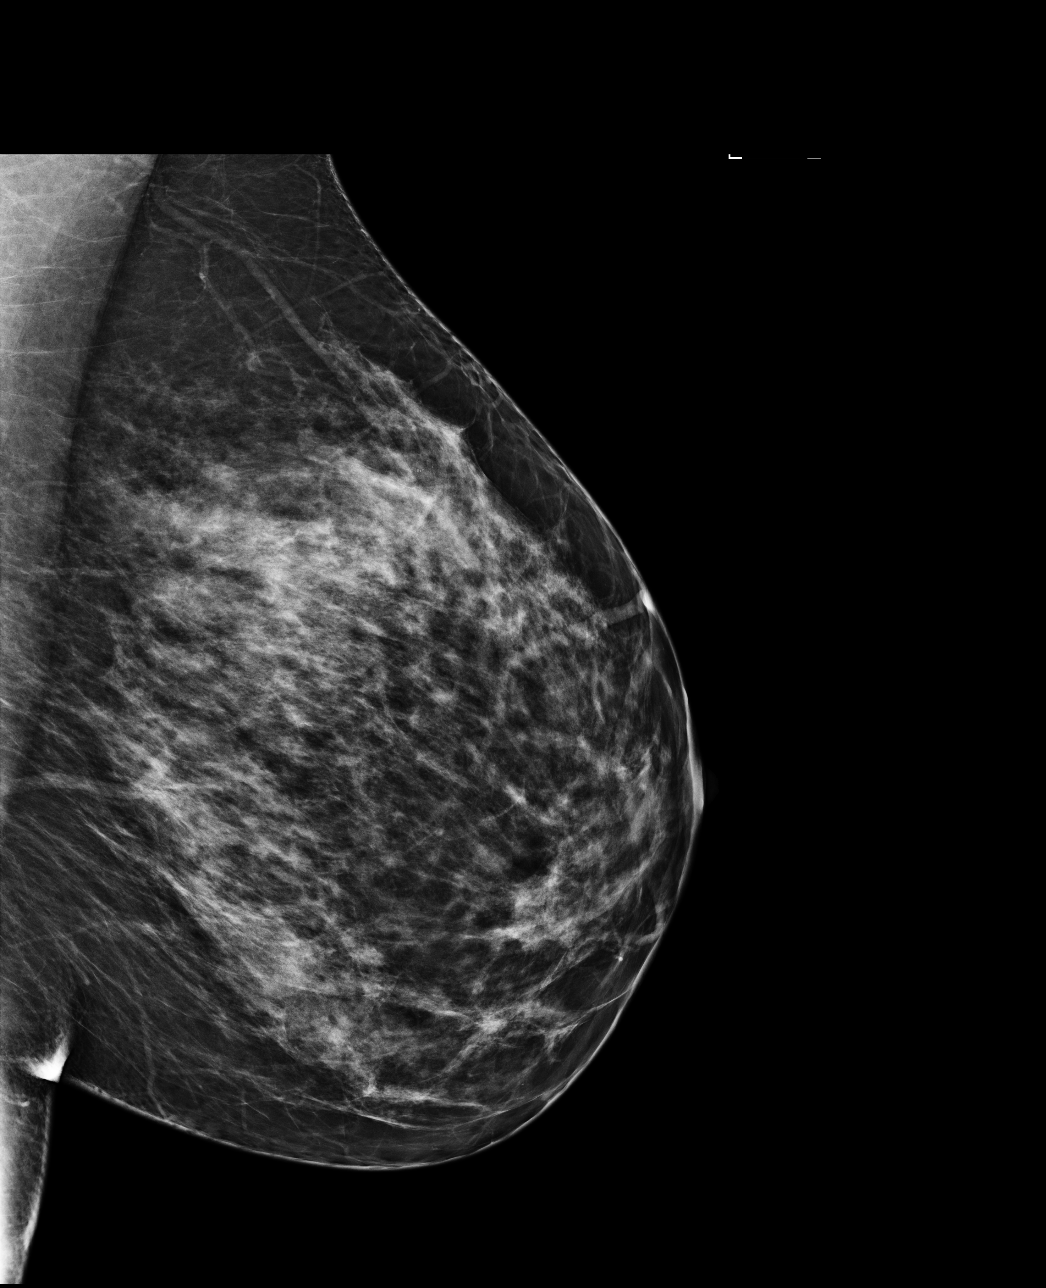

[R MLO]
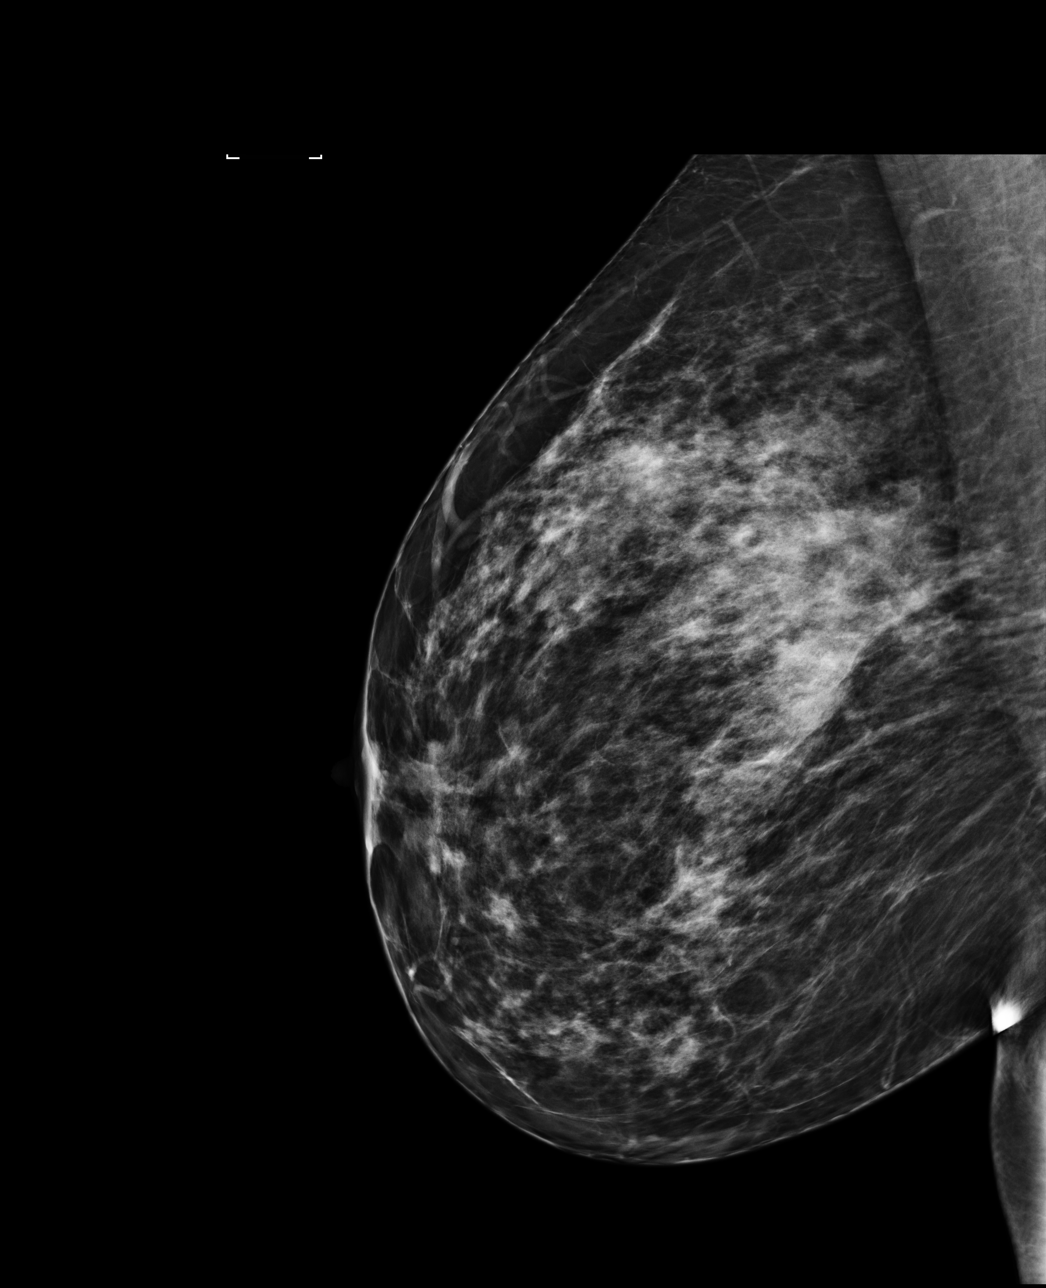

[R CC]
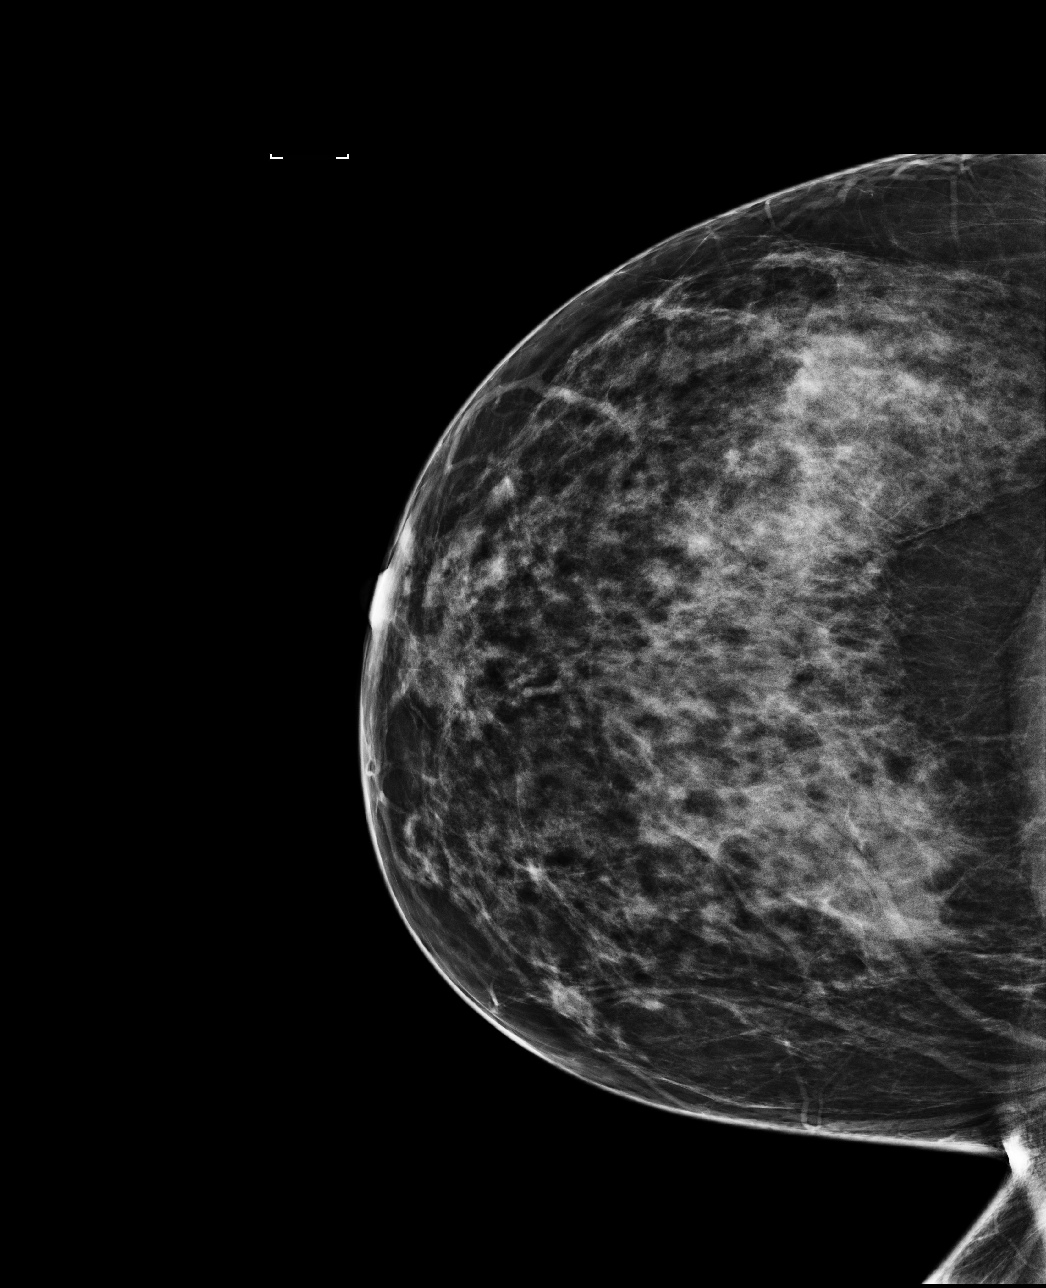

[4 of 4 positions shown; findings below may reference images not displayed]

ACR Breast Density Category c: The breast tissue is heterogeneously
dense, which may obscure small masses
FINDINGS: There are no findings suspicious for malignancy. Images were
processed with CAD.
IMPRESSION: No mammographic evidence of malignancy. A result letter of this
screening mammogram will be mailed directly to the patient.

RECOMMENDATION:
Screening mammogram at age 42. (Code:KL-Z-AQU)

BI-RADS CATEGORY  1: Negative.

## 2023-09-30 ENCOUNTER — Encounter: Payer: Self-pay | Admitting: Family Medicine

## 2023-09-30 ENCOUNTER — Ambulatory Visit (INDEPENDENT_AMBULATORY_CARE_PROVIDER_SITE_OTHER): Admitting: Family Medicine

## 2023-09-30 VITALS — BP 112/72 | HR 73 | Temp 98.0°F | Ht 66.0 in | Wt 177.2 lb

## 2023-09-30 DIAGNOSIS — Z23 Encounter for immunization: Secondary | ICD-10-CM

## 2023-09-30 DIAGNOSIS — Z13220 Encounter for screening for lipoid disorders: Secondary | ICD-10-CM | POA: Diagnosis not present

## 2023-09-30 DIAGNOSIS — R5383 Other fatigue: Secondary | ICD-10-CM | POA: Diagnosis not present

## 2023-09-30 DIAGNOSIS — Z0000 Encounter for general adult medical examination without abnormal findings: Secondary | ICD-10-CM

## 2023-09-30 LAB — COMPREHENSIVE METABOLIC PANEL WITH GFR
ALT: 26 U/L (ref 0–35)
AST: 16 U/L (ref 0–37)
Albumin: 4.4 g/dL (ref 3.5–5.2)
Alkaline Phosphatase: 44 U/L (ref 39–117)
BUN: 11 mg/dL (ref 6–23)
CO2: 28 meq/L (ref 19–32)
Calcium: 9.4 mg/dL (ref 8.4–10.5)
Chloride: 104 meq/L (ref 96–112)
Creatinine, Ser: 0.65 mg/dL (ref 0.40–1.20)
GFR: 113.15 mL/min (ref >60.00)
Glucose, Bld: 87 mg/dL (ref 70–99)
Potassium: 3.9 meq/L (ref 3.5–5.1)
Sodium: 138 meq/L (ref 135–145)
Total Bilirubin: 0.5 mg/dL (ref 0.2–1.2)
Total Protein: 7.4 g/dL (ref 6.0–8.3)

## 2023-09-30 LAB — CBC WITH DIFFERENTIAL/PLATELET
Basophils Absolute: 0.1 K/uL (ref 0.0–0.1)
Basophils Relative: 0.9 % (ref 0.0–3.0)
Eosinophils Absolute: 0.2 K/uL (ref 0.0–0.7)
Eosinophils Relative: 2.8 % (ref 0.0–5.0)
HCT: 40.8 % (ref 36.0–46.0)
Hemoglobin: 13.5 g/dL (ref 12.0–15.0)
Lymphocytes Relative: 30.5 % (ref 12.0–46.0)
Lymphs Abs: 2.2 K/uL (ref 0.7–4.0)
MCHC: 33.2 g/dL (ref 30.0–36.0)
MCV: 88.1 fl (ref 78.0–100.0)
Monocytes Absolute: 0.6 K/uL (ref 0.1–1.0)
Monocytes Relative: 8.5 % (ref 3.0–12.0)
Neutro Abs: 4.1 K/uL (ref 1.4–7.7)
Neutrophils Relative %: 57.3 % (ref 43.0–77.0)
Platelets: 299 K/uL (ref 150.0–400.0)
RBC: 4.63 Mil/uL (ref 3.87–5.11)
RDW: 12.9 % (ref 11.5–15.5)
WBC: 7.2 K/uL (ref 4.0–10.5)

## 2023-09-30 LAB — LIPID PANEL
Cholesterol: 141 mg/dL (ref 0–200)
HDL: 49.3 mg/dL (ref >39.00)
LDL Cholesterol: 82 mg/dL (ref 0–99)
NonHDL: 91.9
Total CHOL/HDL Ratio: 3
Triglycerides: 49 mg/dL (ref 0.0–149.0)
VLDL: 9.8 mg/dL (ref 0.0–40.0)

## 2023-09-30 LAB — T4, FREE: Free T4: 0.82 ng/dL (ref 0.60–1.60)

## 2023-09-30 LAB — VITAMIN D 25 HYDROXY (VIT D DEFICIENCY, FRACTURES): VITD: 25.45 ng/mL — ABNORMAL LOW (ref 30.00–100.00)

## 2023-09-30 LAB — HEMOGLOBIN A1C: Hgb A1c MFr Bld: 5.6 % (ref 4.6–6.5)

## 2023-09-30 LAB — TSH: TSH: 0.87 u[IU]/mL (ref 0.35–5.50)

## 2023-09-30 NOTE — Patient Instructions (Addendum)
 As always it was great seeing you!  Don't forget to say NO sometimes.

## 2023-09-30 NOTE — Progress Notes (Signed)
 Established Patient Office Visit   Subjective  Patient ID: Kerry Potter, female    DOB: 1985/09/27  Age: 38 y.o. MRN: [id number]  Chief Complaint  Patient presents with   Annual Exam    Patient is a 38 year old female seen for CPE.  Patient states she is doing well overall.  Staying busy at work.  Working on setting boundaries to have a better worklife balance.  Trying to be more intentional about managing weight.  Typically eats dinner at home but at times is so busy during the day may forget to stop and take a break.    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   Goiter 08/17/2019   Symptomatic mammary hypertrophy 08/17/2019   Indication for care in labor or delivery 12/06/2017   Postpartum care following vaginal delivery (10/7) 12/06/2017   SVD (spontaneous vaginal delivery) 12/08/2015   Past Medical History:  Diagnosis Date   Acute blood loss anemia 12/10/2015   Family history of bladder cancer    Family history of breast cancer    Family history of ovarian cancer    Family history of prostate cancer    Medical history non-contributory    Past Surgical History:  Procedure Laterality Date   BREAST REDUCTION SURGERY Bilateral 03/27/2021   Procedure: BREAST REDUCTION WITH LIPOSUCTION;  Surgeon: Lowery Estefana RAMAN, DO;  Location: [facility];  Service: Plastics;  Laterality: Bilateral;  3 hours   LIPOMA EXCISION N/A 03/27/2021   Procedure: excision of mole on face;  Surgeon: Lowery Estefana RAMAN, DO;  Location:  SUR[facility]ERY [facility];  Service: Plastics;  Laterality: N/A;   OVARIAN  CYST REMOVAL Left 2004   Social History   Tobacco Use   Smoking status: Never   Smokeless tobacco: Never  Vaping Use   Vaping status: Never Used  Substance Use Topics   Alcohol use: Yes    Comment: social   Drug use: No   Family History  Problem Relation Age of Onset   Ovarian cancer Mother 44   Prostate cancer Father 89   Hypertension Father    Aneurysm Maternal Grandmother    Arthritis Maternal Grandfather    Dementia Maternal Grandfather    COPD Maternal Grandfather    Hypertension Maternal Grandfather    Bladder Cancer Paternal Grandmother 38   Hypertension Paternal Grandmother    Melanoma Paternal Grandmother    Prostate cancer Paternal Grandfather    Heart disease Paternal Grandfather    Hypertension Paternal Grandfather    Diabetes Paternal Grandfather    Appendicitis Maternal Aunt    Cancer Maternal Aunt        in pelvis   Breast cancer Maternal Aunt        bilateral   Appendicitis Maternal Aunt    Breast cancer Maternal Aunt    Arthritis Maternal Uncle    Hypertension Maternal Uncle    Alcohol abuse Maternal Uncle    Heart disease Maternal Uncle    Arthritis Maternal Uncle    Alcohol abuse Maternal Uncle    Hypertension Maternal Uncle    Appendicitis Maternal Uncle  Bladder Cancer Paternal Uncle    Prostate cancer Paternal Uncle        metastatic   Prostate cancer Paternal Uncle    Prostate cancer Paternal Great-grandfather    Brain cancer Other        sister of PGF   Breast cancer Other        2 sisters of PGF   Allergies  Allergen Reactions   Mobic [Meloxicam] Diarrhea    ROS Negative unless stated above    Objective:     BP 112/72 (BP Location: Left Arm, Patient Position: Sitting, Cuff Size: Normal)   Pulse 73   Temp 98 F (36.7 C) (Oral)   Ht 5' 6 (1.676 m)   Wt 177 lb 3.2 oz (80.4 kg)   LMP  (LMP Unknown)   SpO2 100%   BMI 28.60 kg/m  BP Readings from Last 3 Encounters:  09/30/23 112/72  10/30/21 112/78  09/08/21 110/78    Wt Readings from Last 3 Encounters:  09/30/23 177 lb 3.2 oz (80.4 kg)  10/30/21 172 lb 0.3 oz (78 kg)  09/08/21 170 lb 6.4 oz (77.3 kg)      Physical Exam Constitutional:      Appearance: Normal appearance.  HENT:     Head: Normocephalic and atraumatic.     Right Ear: Tympanic membrane, ear canal and external ear normal.     Left Ear: Tympanic membrane, ear canal and external ear normal.     Nose: Nose normal.     Mouth/Throat:     Mouth: Mucous membranes are moist.     Pharynx: No oropharyngeal exudate or posterior oropharyngeal erythema.  Eyes:     General: No scleral icterus.    Extraocular Movements: Extraocular movements intact.     Conjunctiva/sclera: Conjunctivae normal.     Pupils: Pupils are equal, round, and reactive to light.  Neck:     Thyroid : No thyromegaly.  Cardiovascular:     Rate and Rhythm: Normal rate and regular rhythm.     Pulses: Normal pulses.     Heart sounds: Normal heart sounds. No murmur heard.    No friction rub.  Pulmonary:     Effort: Pulmonary effort is normal.     Breath sounds: Normal breath sounds. No wheezing, rhonchi or rales.  Abdominal:     General: Bowel sounds are normal.     Palpations: Abdomen is soft.     Tenderness: There is no abdominal tenderness.  Musculoskeletal:        General: No deformity. Normal range of motion.  Lymphadenopathy:     Cervical: No cervical adenopathy.  Skin:    General: Skin is warm and dry.     Findings: No lesion.  Neurological:     General: No focal deficit present.     Mental Status: She is alert and oriented to person, place, and time.  Psychiatric:        Mood and Affect: Mood normal.        Thought Content: Thought content normal.        09/30/2023    9:46 AM 10/30/2021    4:16 PM 09/08/2021    2:00 PM  Depression screen PHQ 2/9  Decreased Interest 0 0 0  Down, Depressed, Hopeless 0 0 0  PHQ - 2 Score 0 0 0  Altered sleeping 1 0 1  Tired, decreased energy 1 0 0  Change in  appetite 0 0 0  Feeling bad or failure about yourself  1  0 1  Trouble concentrating 0 0 0  Moving slowly or fidgety/restless 0 0 0  Suicidal thoughts 0 0 0  PHQ-9 Score 3 0 2  Difficult doing work/chores Somewhat difficult  Not difficult at all      09/30/2023    9:46 AM 10/18/2020    1:18 PM  GAD 7 : Generalized Anxiety Score  Nervous, Anxious, on Edge 2 1  Control/stop worrying 2   Worry too much - different things  1  Trouble relaxing 2   Restless 0   Easily annoyed or irritable 1 1  Afraid - awful might happen 3   Anxiety Difficulty Somewhat difficult Somewhat difficult     No results found for any visits on 09/30/23.    Assessment & Plan:   Well adult exam -     CBC with Differential/Platelet; Future -     Comprehensive metabolic panel with GFR; Future -     Hemoglobin A1c; Future -     Lipid panel; Future -     T4, free; Future -     TSH; Future  Fatigue, unspecified type -     CBC with Differential/Platelet; Future -     Hemoglobin A1c; Future -     T4, free; Future -     TSH; Future -     VITAMIN D  25 Hydroxy (Vit-D Deficiency, Fractures); Future  Need for Tdap vaccination -     Tdap vaccine greater than or equal to 7yo IM  Age-appropriate health screenings discussed.  Obtain labs.  Immunizations reviewed.  Tdap given this visit.  Pap done in 2023 with OB/GYN.  Colonoscopy and mammogram not yet indicated 2/2 age.  Return if symptoms worsen or fail to improve.   Clotilda JONELLE Single, MD

## 2023-10-08 ENCOUNTER — Ambulatory Visit: Payer: Self-pay | Admitting: Family Medicine

## 2023-10-08 DIAGNOSIS — E559 Vitamin D deficiency, unspecified: Secondary | ICD-10-CM

## 2023-10-08 MED ORDER — VITAMIN D (ERGOCALCIFEROL) 1.25 MG (50000 UNIT) PO CAPS: 50000.0000 [IU] | ORAL_CAPSULE | ORAL | 0 refills | Status: AC
